# Patient Record
Sex: Male | Born: 1945
Health system: Southern US, Community
[De-identification: ages and names within clinical notes are randomized; demographics above are authoritative.]

## PROBLEM LIST (undated history)

## (undated) DIAGNOSIS — N400 Enlarged prostate without lower urinary tract symptoms: Secondary | ICD-10-CM

## (undated) DIAGNOSIS — K219 Gastro-esophageal reflux disease without esophagitis: Secondary | ICD-10-CM

## (undated) DIAGNOSIS — E785 Hyperlipidemia, unspecified: Secondary | ICD-10-CM

## (undated) DIAGNOSIS — H409 Unspecified glaucoma: Secondary | ICD-10-CM

## (undated) DIAGNOSIS — Z7251 High risk heterosexual behavior: Secondary | ICD-10-CM

## (undated) DIAGNOSIS — B459 Cryptococcosis, unspecified: Secondary | ICD-10-CM

## (undated) DIAGNOSIS — H269 Unspecified cataract: Secondary | ICD-10-CM

## (undated) DIAGNOSIS — B20 Human immunodeficiency virus [HIV] disease: Secondary | ICD-10-CM

## (undated) DIAGNOSIS — E119 Type 2 diabetes mellitus without complications: Secondary | ICD-10-CM

## (undated) DIAGNOSIS — Z9189 Other specified personal risk factors, not elsewhere classified: Secondary | ICD-10-CM

## (undated) DIAGNOSIS — B59 Pneumocystosis: Secondary | ICD-10-CM

## (undated) DIAGNOSIS — K810 Acute cholecystitis: Secondary | ICD-10-CM

## (undated) HISTORY — DX: Pneumocystosis: B59

## (undated) HISTORY — DX: Hyperlipidemia, unspecified: E78.5

## (undated) HISTORY — DX: Cryptococcosis, unspecified: B45.9

## (undated) HISTORY — DX: Unspecified cataract: H26.9

## (undated) HISTORY — DX: Acute cholecystitis: K81.0

## (undated) HISTORY — PX: ERCP: SHX60

## (undated) HISTORY — DX: Gastro-esophageal reflux disease without esophagitis: K21.9

## (undated) HISTORY — PX: CHOLECYSTECTOMY: SHX55

## (undated) HISTORY — DX: Unspecified glaucoma: H40.9

## (undated) HISTORY — DX: Type 2 diabetes mellitus without complications: E11.9

## (undated) HISTORY — DX: Other specified personal risk factors, not elsewhere classified: Z91.89

## (undated) HISTORY — DX: Human immunodeficiency virus (HIV) disease: B20

## (undated) HISTORY — DX: Benign prostatic hyperplasia without lower urinary tract symptoms: N40.0

## (undated) HISTORY — DX: High risk heterosexual behavior: Z72.51

---

## 2000-02-19 ENCOUNTER — Other Ambulatory Visit: Admission: RE | Admit: 2000-02-19 | Discharge: 2000-02-19 | Payer: Self-pay | Admitting: Urology

## 2001-02-23 ENCOUNTER — Emergency Department (HOSPITAL_COMMUNITY): Admission: EM | Admit: 2001-02-23 | Discharge: 2001-02-23 | Payer: Self-pay | Admitting: Emergency Medicine

## 2001-02-23 ENCOUNTER — Encounter: Payer: Self-pay | Admitting: Emergency Medicine

## 2004-12-14 ENCOUNTER — Ambulatory Visit (HOSPITAL_COMMUNITY): Admission: RE | Admit: 2004-12-14 | Discharge: 2004-12-14 | Payer: Self-pay | Admitting: General Surgery

## 2005-04-05 ENCOUNTER — Encounter: Admission: RE | Admit: 2005-04-05 | Discharge: 2005-04-05 | Payer: Self-pay | Admitting: Family Medicine

## 2005-04-30 ENCOUNTER — Inpatient Hospital Stay (HOSPITAL_COMMUNITY): Admission: EM | Admit: 2005-04-30 | Discharge: 2005-05-09 | Payer: Self-pay | Admitting: Emergency Medicine

## 2005-04-30 ENCOUNTER — Encounter: Admission: RE | Admit: 2005-04-30 | Discharge: 2005-04-30 | Payer: Self-pay | Admitting: Family Medicine

## 2005-04-30 ENCOUNTER — Ambulatory Visit: Payer: Self-pay | Admitting: Infectious Diseases

## 2005-05-27 ENCOUNTER — Ambulatory Visit: Payer: Self-pay | Admitting: Infectious Diseases

## 2005-06-19 ENCOUNTER — Encounter: Admission: RE | Admit: 2005-06-19 | Discharge: 2005-06-19 | Payer: Self-pay | Admitting: Family Medicine

## 2005-07-29 ENCOUNTER — Ambulatory Visit (HOSPITAL_COMMUNITY): Admission: RE | Admit: 2005-07-29 | Discharge: 2005-07-29 | Payer: Self-pay | Admitting: Infectious Diseases

## 2005-07-29 ENCOUNTER — Ambulatory Visit: Payer: Self-pay | Admitting: Infectious Diseases

## 2005-07-29 ENCOUNTER — Encounter (INDEPENDENT_AMBULATORY_CARE_PROVIDER_SITE_OTHER): Payer: Self-pay | Admitting: *Deleted

## 2005-07-29 LAB — CONVERTED CEMR LAB
CD4 Count: 160 microliters
CD4 T Cell Abs: 160

## 2005-09-02 ENCOUNTER — Ambulatory Visit: Payer: Self-pay | Admitting: Infectious Diseases

## 2005-11-27 ENCOUNTER — Ambulatory Visit (HOSPITAL_COMMUNITY): Admission: RE | Admit: 2005-11-27 | Discharge: 2005-11-27 | Payer: Self-pay | Admitting: Infectious Diseases

## 2005-11-27 ENCOUNTER — Ambulatory Visit: Payer: Self-pay | Admitting: Infectious Diseases

## 2005-12-16 ENCOUNTER — Ambulatory Visit: Payer: Self-pay | Admitting: Infectious Diseases

## 2006-04-07 ENCOUNTER — Encounter: Admission: RE | Admit: 2006-04-07 | Discharge: 2006-04-07 | Payer: Self-pay | Admitting: Infectious Diseases

## 2006-04-07 ENCOUNTER — Ambulatory Visit: Payer: Self-pay | Admitting: Infectious Diseases

## 2006-04-07 ENCOUNTER — Encounter (INDEPENDENT_AMBULATORY_CARE_PROVIDER_SITE_OTHER): Payer: Self-pay | Admitting: *Deleted

## 2006-04-07 LAB — CONVERTED CEMR LAB: HIV 1 RNA Quant: 61 copies/mL

## 2006-04-21 ENCOUNTER — Ambulatory Visit: Payer: Self-pay | Admitting: Infectious Diseases

## 2006-06-24 ENCOUNTER — Ambulatory Visit: Payer: Self-pay | Admitting: Infectious Diseases

## 2006-06-24 ENCOUNTER — Encounter: Admission: RE | Admit: 2006-06-24 | Discharge: 2006-06-24 | Payer: Self-pay | Admitting: Infectious Diseases

## 2006-06-24 ENCOUNTER — Encounter (INDEPENDENT_AMBULATORY_CARE_PROVIDER_SITE_OTHER): Payer: Self-pay | Admitting: *Deleted

## 2006-06-24 LAB — CONVERTED CEMR LAB: HIV 1 RNA Quant: 49 copies/mL

## 2006-07-14 ENCOUNTER — Ambulatory Visit: Payer: Self-pay | Admitting: Infectious Diseases

## 2006-10-22 ENCOUNTER — Encounter (INDEPENDENT_AMBULATORY_CARE_PROVIDER_SITE_OTHER): Payer: Self-pay | Admitting: *Deleted

## 2006-10-22 ENCOUNTER — Encounter: Admission: RE | Admit: 2006-10-22 | Discharge: 2006-10-22 | Payer: Self-pay | Admitting: Infectious Diseases

## 2006-10-22 ENCOUNTER — Ambulatory Visit: Payer: Self-pay | Admitting: Infectious Diseases

## 2006-10-22 LAB — CONVERTED CEMR LAB
AST: 22 units/L (ref 0–37)
BUN: 15 mg/dL (ref 6–23)
Basophils Relative: 0 % (ref 0–1)
CO2: 21 meq/L (ref 19–32)
HCT: 41.1 % (ref 41.0–49.0)
HIV 1 RNA Quant: 96 copies/mL — ABNORMAL HIGH (ref <50)
Hemoglobin: 13.9 g/dL (ref 13.9–16.8)
MCV: 86.9 fL (ref 78.8–100.0)
Monocytes Absolute: 0.3 10*3/uL (ref 0.2–0.7)
Neutro Abs: 2.4 10*3/uL (ref 1.8–6.8)
Platelets: 275 10*3/uL (ref 152–374)
Potassium: 4.5 meq/L (ref 3.5–5.3)
Sodium: 136 meq/L (ref 135–145)
Total CHOL/HDL Ratio: 9.3
Triglycerides: 442 mg/dL — ABNORMAL HIGH (ref <150)
WBC: 4.8 10*3/uL (ref 3.7–10.0)

## 2006-11-05 ENCOUNTER — Ambulatory Visit: Payer: Self-pay | Admitting: Infectious Diseases

## 2006-12-10 DIAGNOSIS — B59 Pneumocystosis: Secondary | ICD-10-CM | POA: Insufficient documentation

## 2006-12-10 DIAGNOSIS — B459 Cryptococcosis, unspecified: Secondary | ICD-10-CM | POA: Insufficient documentation

## 2006-12-10 DIAGNOSIS — B20 Human immunodeficiency virus [HIV] disease: Secondary | ICD-10-CM | POA: Insufficient documentation

## 2007-01-26 ENCOUNTER — Encounter (INDEPENDENT_AMBULATORY_CARE_PROVIDER_SITE_OTHER): Payer: Self-pay | Admitting: *Deleted

## 2007-01-26 LAB — CONVERTED CEMR LAB

## 2007-01-28 ENCOUNTER — Encounter (INDEPENDENT_AMBULATORY_CARE_PROVIDER_SITE_OTHER): Payer: Self-pay | Admitting: Infectious Diseases

## 2007-02-08 ENCOUNTER — Encounter (INDEPENDENT_AMBULATORY_CARE_PROVIDER_SITE_OTHER): Payer: Self-pay | Admitting: *Deleted

## 2007-04-20 ENCOUNTER — Ambulatory Visit: Payer: Self-pay | Admitting: Infectious Diseases

## 2007-04-20 ENCOUNTER — Encounter: Admission: RE | Admit: 2007-04-20 | Discharge: 2007-04-20 | Payer: Self-pay | Admitting: Infectious Diseases

## 2007-04-20 LAB — CONVERTED CEMR LAB
ALT: 10 units/L (ref 0–53)
AST: 13 units/L (ref 0–37)
Albumin: 4.1 g/dL (ref 3.5–5.2)
Alkaline Phosphatase: 114 units/L (ref 39–117)
CO2: 21 meq/L (ref 19–32)
Chloride: 109 meq/L (ref 96–112)
Creatinine, Ser: 1.16 mg/dL (ref 0.40–1.50)
Eosinophils Absolute: 0.1 10*3/uL (ref 0.0–0.7)
Eosinophils Relative: 1 % (ref 0–5)
Glucose, Bld: 98 mg/dL (ref 70–99)
HIV-1 RNA Quant, Log: <1.7 (ref <1.70)
Ketones, ur: NEGATIVE mg/dL
Neutrophils Relative %: 68 % (ref 43–77)
Nitrite: NEGATIVE
Potassium: 4.2 meq/L (ref 3.5–5.3)
Protein, ur: NEGATIVE mg/dL
Sodium: 140 meq/L (ref 135–145)
Specific Gravity, Urine: 1.025 (ref 1.005–1.03)
Total Protein: 8.2 g/dL (ref 6.0–8.3)
Urobilinogen, UA: 0.2 (ref 0.0–1.0)
WBC: 8.3 10*3/uL (ref 4.0–10.5)

## 2007-05-11 ENCOUNTER — Ambulatory Visit: Payer: Self-pay | Admitting: Infectious Diseases

## 2007-05-12 ENCOUNTER — Encounter (INDEPENDENT_AMBULATORY_CARE_PROVIDER_SITE_OTHER): Payer: Self-pay | Admitting: Infectious Diseases

## 2007-09-07 ENCOUNTER — Encounter: Admission: RE | Admit: 2007-09-07 | Discharge: 2007-09-07 | Payer: Self-pay | Admitting: Infectious Disease

## 2007-09-07 ENCOUNTER — Ambulatory Visit: Payer: Self-pay | Admitting: Infectious Disease

## 2007-09-07 LAB — CONVERTED CEMR LAB
Albumin: 4.2 g/dL (ref 3.5–5.2)
Alkaline Phosphatase: 126 units/L — ABNORMAL HIGH (ref 39–117)
Basophils Absolute: 0 10*3/uL (ref 0.0–0.1)
Basophils Relative: 0 % (ref 0–1)
Creatinine, Ser: 1.18 mg/dL (ref 0.40–1.50)
Eosinophils Absolute: 0.1 10*3/uL (ref 0.0–0.7)
Eosinophils Relative: 1 % (ref 0–5)
HIV-1 RNA Quant, Log: 2.68 — ABNORMAL HIGH (ref <1.70)
Hemoglobin: 14 g/dL (ref 13.0–17.0)
Lymphs Abs: 1.9 10*3/uL (ref 0.7–3.3)
Monocytes Relative: 6 % (ref 3–11)
Neutro Abs: 4.2 10*3/uL (ref 1.7–7.7)
Neutrophils Relative %: 64 % (ref 43–77)
RBC: 4.82 M/uL (ref 4.22–5.81)
Sodium: 141 meq/L (ref 135–145)
Total Protein: 8.2 g/dL (ref 6.0–8.3)

## 2007-09-17 ENCOUNTER — Encounter (INDEPENDENT_AMBULATORY_CARE_PROVIDER_SITE_OTHER): Payer: Self-pay | Admitting: *Deleted

## 2007-09-21 ENCOUNTER — Ambulatory Visit: Payer: Self-pay | Admitting: Infectious Disease

## 2007-09-21 DIAGNOSIS — N400 Enlarged prostate without lower urinary tract symptoms: Secondary | ICD-10-CM

## 2007-09-21 DIAGNOSIS — N401 Enlarged prostate with lower urinary tract symptoms: Secondary | ICD-10-CM | POA: Insufficient documentation

## 2007-10-21 ENCOUNTER — Encounter: Admission: RE | Admit: 2007-10-21 | Discharge: 2007-10-21 | Payer: Self-pay | Admitting: Infectious Disease

## 2007-10-21 ENCOUNTER — Ambulatory Visit: Payer: Self-pay | Admitting: Infectious Disease

## 2007-10-21 LAB — CONVERTED CEMR LAB
Albumin: 4 g/dL (ref 3.5–5.2)
Alkaline Phosphatase: 120 units/L — ABNORMAL HIGH (ref 39–117)
CO2: 20 meq/L (ref 19–32)
Calcium: 8.8 mg/dL (ref 8.4–10.5)
Cholesterol: 255 mg/dL — ABNORMAL HIGH (ref 0–200)
Eosinophils Absolute: 0 10*3/uL — ABNORMAL LOW (ref 0.2–0.7)
Eosinophils Relative: 1 % (ref 0–5)
Glucose, Bld: 87 mg/dL (ref 70–99)
HCT: 40.7 % (ref 39.0–52.0)
HDL: 29 mg/dL — ABNORMAL LOW (ref >39)
HIV-1 RNA Quant, Log: 2.18 — ABNORMAL HIGH (ref <1.70)
Lymphocytes Relative: 36 % (ref 12–46)
MCV: 86.4 fL (ref 78.0–100.0)
Monocytes Relative: 6 % (ref 3–12)
Neutrophils Relative %: 57 % (ref 43–77)
Total Bilirubin: 0.3 mg/dL (ref 0.3–1.2)
Total CHOL/HDL Ratio: 8.8
Triglycerides: 220 mg/dL — ABNORMAL HIGH (ref <150)
VLDL: 44 mg/dL — ABNORMAL HIGH (ref 0–40)
WBC: 5.3 10*3/uL (ref 4.0–10.5)

## 2007-11-09 ENCOUNTER — Ambulatory Visit: Payer: Self-pay | Admitting: Infectious Disease

## 2007-11-09 DIAGNOSIS — E785 Hyperlipidemia, unspecified: Secondary | ICD-10-CM | POA: Insufficient documentation

## 2007-11-11 ENCOUNTER — Telehealth: Payer: Self-pay | Admitting: Infectious Disease

## 2008-03-08 ENCOUNTER — Encounter: Admission: RE | Admit: 2008-03-08 | Discharge: 2008-03-08 | Payer: Self-pay | Admitting: Infectious Disease

## 2008-03-08 ENCOUNTER — Ambulatory Visit: Payer: Self-pay | Admitting: Infectious Disease

## 2008-03-08 LAB — CONVERTED CEMR LAB
AST: 22 units/L (ref 0–37)
Albumin: 4.3 g/dL (ref 3.5–5.2)
BUN: 15 mg/dL (ref 6–23)
Basophils Absolute: 0 10*3/uL (ref 0.0–0.1)
Calcium: 9.1 mg/dL (ref 8.4–10.5)
Creatinine, Ser: 1.23 mg/dL (ref 0.40–1.50)
Glucose, Bld: 99 mg/dL (ref 70–99)
HCT: 41.7 % (ref 39.0–52.0)
Hemoglobin: 13.5 g/dL (ref 13.0–17.0)
Lymphocytes Relative: 44 % (ref 12–46)
MCV: 88 fL (ref 78.0–100.0)
Monocytes Absolute: 0.4 10*3/uL (ref 0.1–1.0)
Neutro Abs: 2.9 10*3/uL (ref 1.7–7.7)
Neutrophils Relative %: 49 % (ref 43–77)
Platelets: 255 10*3/uL (ref 150–400)
RDW: 14.8 % (ref 11.5–15.5)
Total Bilirubin: 0.3 mg/dL (ref 0.3–1.2)
Total CHOL/HDL Ratio: 6
Total Protein: 8.4 g/dL — ABNORMAL HIGH (ref 6.0–8.3)
Triglycerides: 193 mg/dL — ABNORMAL HIGH (ref <150)

## 2008-03-28 ENCOUNTER — Ambulatory Visit: Payer: Self-pay | Admitting: Infectious Disease

## 2008-07-12 ENCOUNTER — Encounter: Admission: RE | Admit: 2008-07-12 | Discharge: 2008-07-12 | Payer: Self-pay | Admitting: Infectious Disease

## 2008-07-12 ENCOUNTER — Ambulatory Visit: Payer: Self-pay | Admitting: Infectious Disease

## 2008-07-12 LAB — CONVERTED CEMR LAB
AST: 20 units/L (ref 0–37)
Alkaline Phosphatase: 113 units/L (ref 39–117)
Basophils Relative: 0 % (ref 0–1)
Chloride: 107 meq/L (ref 96–112)
Cholesterol: 216 mg/dL — ABNORMAL HIGH (ref 0–200)
Creatinine, Ser: 1.25 mg/dL (ref 0.40–1.50)
HIV 1 RNA Quant: 54 copies/mL — ABNORMAL HIGH (ref <50)
LDL Cholesterol: 151 mg/dL — ABNORMAL HIGH (ref 0–99)
Lymphocytes Relative: 32 % (ref 12–46)
Lymphs Abs: 1.7 10*3/uL (ref 0.7–4.0)
MCHC: 32.7 g/dL (ref 30.0–36.0)
MCV: 88 fL (ref 78.0–100.0)
Monocytes Absolute: 0.3 10*3/uL (ref 0.1–1.0)
Monocytes Relative: 5 % (ref 3–12)
Neutrophils Relative %: 61 % (ref 43–77)
Platelets: 256 10*3/uL (ref 150–400)
Total Bilirubin: 0.3 mg/dL (ref 0.3–1.2)
Total CHOL/HDL Ratio: 6.5
Total Protein: 8.7 g/dL — ABNORMAL HIGH (ref 6.0–8.3)
VLDL: 32 mg/dL (ref 0–40)
WBC: 5.4 10*3/uL (ref 4.0–10.5)

## 2008-07-26 ENCOUNTER — Ambulatory Visit: Payer: Self-pay | Admitting: Infectious Disease

## 2008-11-15 ENCOUNTER — Ambulatory Visit: Payer: Self-pay | Admitting: Infectious Disease

## 2008-11-15 LAB — CONVERTED CEMR LAB
ALT: 18 U/L
AST: 17 U/L
Albumin: 4.2 g/dL
Alkaline Phosphatase: 92 U/L
BUN: 16 mg/dL
Basophils Absolute: 0 K/uL
Basophils Relative: 0 %
CO2: 20 meq/L
Calcium: 8.6 mg/dL
Chlamydia, Swab/Urine, PCR: NEGATIVE
Chloride: 108 meq/L
Cholesterol: 164 mg/dL
Creatinine, Ser: 1.34 mg/dL
Eosinophils Absolute: 0.1 K/uL
Eosinophils Relative: 2 %
GC Probe Amp, Urine: NEGATIVE
Glucose, Bld: 100 mg/dL — ABNORMAL HIGH
HCT: 43.1 %
HDL: 30 mg/dL — ABNORMAL LOW
HIV 1 RNA Quant: 339 copies/mL — ABNORMAL HIGH (ref <48)
Hemoglobin: 14.2 g/dL
LDL Cholesterol: 111 mg/dL — ABNORMAL HIGH
Lymphocytes Relative: 32 %
Lymphs Abs: 1.6 K/uL
MCHC: 32.9 g/dL
MCV: 88.1 fL
Monocytes Absolute: 0.2 K/uL
Monocytes Relative: 5 %
Neutro Abs: 3 K/uL
Neutrophils Relative %: 61 %
Platelets: 233 K/uL
Potassium: 4.2 meq/L
RBC: 4.89 M/uL
RDW: 14.5 %
Sodium: 141 meq/L
Total Bilirubin: 0.3 mg/dL
Total CHOL/HDL Ratio: 5.5
Total Protein: 8.1 g/dL
Triglycerides: 116 mg/dL
VLDL: 23 mg/dL
WBC: 4.9 10*3/microliter

## 2008-12-12 ENCOUNTER — Ambulatory Visit: Payer: Self-pay | Admitting: Infectious Disease

## 2009-04-04 ENCOUNTER — Ambulatory Visit: Payer: Self-pay | Admitting: Infectious Disease

## 2009-04-04 LAB — CONVERTED CEMR LAB
Albumin: 4.1 g/dL (ref 3.5–5.2)
BUN: 17 mg/dL (ref 6–23)
Basophils Relative: 0 % (ref 0–1)
Calcium: 8.9 mg/dL (ref 8.4–10.5)
Creatinine, Ser: 1.25 mg/dL (ref 0.40–1.50)
Eosinophils Absolute: 0.1 10*3/uL (ref 0.0–0.7)
GFR calc non Af Amer: 59 mL/min — ABNORMAL LOW (ref >60)
HCT: 39.5 % (ref 39.0–52.0)
HIV-1 RNA Quant, Log: 2.19 — ABNORMAL HIGH (ref <1.68)
Lymphs Abs: 1.8 10*3/uL (ref 0.7–4.0)
MCHC: 33.4 g/dL (ref 30.0–36.0)
MCV: 83.2 fL (ref 78.0–100.0)
Monocytes Absolute: 0.3 10*3/uL (ref 0.1–1.0)
Monocytes Relative: 5 % (ref 3–12)
Neutrophils Relative %: 61 % (ref 43–77)
Platelets: 232 10*3/uL (ref 150–400)
Potassium: 4.2 meq/L (ref 3.5–5.3)
RDW: 14.9 % (ref 11.5–15.5)
Total Bilirubin: 0.3 mg/dL (ref 0.3–1.2)

## 2009-04-20 ENCOUNTER — Ambulatory Visit: Payer: Self-pay | Admitting: Infectious Disease

## 2009-04-20 LAB — CONVERTED CEMR LAB: LDL Goal: 130 mg/dL

## 2009-07-19 ENCOUNTER — Ambulatory Visit: Payer: Self-pay | Admitting: Infectious Disease

## 2009-07-19 LAB — CONVERTED CEMR LAB
AST: 22 units/L (ref 0–37)
Basophils Absolute: 0 10*3/uL (ref 0.0–0.1)
Basophils Relative: 1 % (ref 0–1)
CO2: 24 meq/L (ref 19–32)
Calcium: 9.4 mg/dL (ref 8.4–10.5)
Creatinine, Ser: 1.31 mg/dL (ref 0.40–1.50)
Eosinophils Absolute: 0.1 10*3/uL (ref 0.0–0.7)
Eosinophils Relative: 1 % (ref 0–5)
Glucose, Bld: 87 mg/dL (ref 70–99)
HIV 1 RNA Quant: 225 copies/mL — ABNORMAL HIGH (ref <48)
HIV-1 RNA Quant, Log: 2.35 — ABNORMAL HIGH (ref <1.68)
Hemoglobin: 13.5 g/dL (ref 13.0–17.0)
Lymphs Abs: 2.3 10*3/uL (ref 0.7–4.0)
MCV: 85.9 fL (ref >=78.0)
Monocytes Absolute: 0.3 10*3/uL (ref 0.1–1.0)
Monocytes Relative: 6 % (ref 3–12)
Neutro Abs: 2.9 10*3/uL (ref 1.7–7.7)
Neutrophils Relative %: 52 % (ref 43–77)
Potassium: 4.6 meq/L (ref 3.5–5.3)
RDW: 14.3 % (ref 11.5–15.5)
Sodium: 141 meq/L (ref 135–145)
WBC: 5.6 10*3/uL (ref 4.0–10.5)

## 2009-08-22 ENCOUNTER — Ambulatory Visit: Payer: Self-pay | Admitting: Infectious Disease

## 2010-01-03 ENCOUNTER — Encounter (INDEPENDENT_AMBULATORY_CARE_PROVIDER_SITE_OTHER): Payer: Self-pay | Admitting: *Deleted

## 2010-05-31 ENCOUNTER — Encounter: Payer: Self-pay | Admitting: Infectious Disease

## 2010-06-07 ENCOUNTER — Ambulatory Visit: Payer: Self-pay | Admitting: Infectious Disease

## 2010-06-07 LAB — CONVERTED CEMR LAB
ALT: 17 units/L (ref 0–53)
Albumin: 4.2 g/dL (ref 3.5–5.2)
Calcium: 9 mg/dL (ref 8.4–10.5)
Chloride: 108 meq/L (ref 96–112)
HIV-1 RNA Quant, Log: <1.68 (ref <1.68)
LDL Cholesterol: 112 mg/dL — ABNORMAL HIGH (ref 0–99)
Total CHOL/HDL Ratio: 6
Triglycerides: 194 mg/dL — ABNORMAL HIGH (ref <150)
VLDL: 39 mg/dL (ref 0–40)

## 2010-06-21 ENCOUNTER — Ambulatory Visit: Payer: Self-pay | Admitting: Infectious Disease

## 2010-06-21 ENCOUNTER — Encounter: Payer: Self-pay | Admitting: Ophthalmology

## 2010-06-21 ENCOUNTER — Telehealth: Payer: Self-pay | Admitting: Internal Medicine

## 2010-06-21 DIAGNOSIS — Z9189 Other specified personal risk factors, not elsewhere classified: Secondary | ICD-10-CM | POA: Insufficient documentation

## 2010-06-21 DIAGNOSIS — R1011 Right upper quadrant pain: Secondary | ICD-10-CM | POA: Insufficient documentation

## 2010-06-21 LAB — CONVERTED CEMR LAB
AST: 54 units/L — ABNORMAL HIGH (ref 0–37)
Albumin: 3.5 g/dL (ref 3.5–5.2)
Alkaline Phosphatase: 145 units/L — ABNORMAL HIGH (ref 39–117)
BUN: 25 mg/dL — ABNORMAL HIGH (ref 6–23)
Basophils Absolute: 0 10*3/uL (ref 0.0–0.1)
Basophils Relative: 0 % (ref 0–1)
Creatinine, Ser: 2.19 mg/dL — ABNORMAL HIGH (ref 0.40–1.50)
Eosinophils Absolute: 0 10*3/uL (ref 0.0–0.7)
HCT: 34.6 % — ABNORMAL LOW (ref 39.0–52.0)
Hemoglobin: 11.7 g/dL — ABNORMAL LOW (ref 13.0–17.0)
MCHC: 33.8 g/dL (ref 30.0–36.0)
MCV: 86.3 fL (ref 78.0–100.0)
Monocytes Relative: 6 % (ref 3–12)
Neutro Abs: 14.3 10*3/uL — ABNORMAL HIGH (ref 1.7–7.7)
Potassium: 3.4 meq/L — ABNORMAL LOW (ref 3.5–5.3)
RBC: 4.01 M/uL — ABNORMAL LOW (ref 4.22–5.81)
RDW: 14.5 % (ref 11.5–15.5)
Total Bilirubin: 0.4 mg/dL (ref 0.3–1.2)
WBC: 16.3 10*3/uL — ABNORMAL HIGH (ref 4.0–10.5)

## 2010-06-22 ENCOUNTER — Inpatient Hospital Stay (HOSPITAL_COMMUNITY): Admission: EM | Admit: 2010-06-22 | Discharge: 2010-06-27 | Payer: Self-pay | Admitting: Internal Medicine

## 2010-06-22 ENCOUNTER — Ambulatory Visit: Payer: Self-pay | Admitting: Internal Medicine

## 2010-06-25 ENCOUNTER — Encounter: Payer: Self-pay | Admitting: Internal Medicine

## 2010-06-25 ENCOUNTER — Ambulatory Visit: Payer: Self-pay | Admitting: Internal Medicine

## 2010-06-27 ENCOUNTER — Encounter: Payer: Self-pay | Admitting: Internal Medicine

## 2010-06-27 DIAGNOSIS — K810 Acute cholecystitis: Secondary | ICD-10-CM | POA: Insufficient documentation

## 2010-07-09 ENCOUNTER — Ambulatory Visit: Payer: Self-pay | Admitting: Infectious Disease

## 2010-07-09 LAB — CONVERTED CEMR LAB
AST: 33 units/L (ref 0–37)
Albumin: 2.8 g/dL — ABNORMAL LOW (ref 3.5–5.2)
BUN: 12 mg/dL (ref 6–23)
Basophils Absolute: 0 10*3/uL (ref 0.0–0.1)
CO2: 24 meq/L (ref 19–32)
Calcium: 9 mg/dL (ref 8.4–10.5)
Chloride: 106 meq/L (ref 96–112)
Creatinine, Ser: 1.15 mg/dL (ref 0.40–1.50)
Glucose, Bld: 119 mg/dL — ABNORMAL HIGH (ref 70–99)
Hemoglobin: 11 g/dL — ABNORMAL LOW (ref 13.0–17.0)
Lymphocytes Relative: 18 % (ref 12–46)
Lymphs Abs: 1.4 10*3/uL (ref 0.7–4.0)
Monocytes Absolute: 0.5 10*3/uL (ref 0.1–1.0)
Neutro Abs: 5.5 10*3/uL (ref 1.7–7.7)
Potassium: 4 meq/L (ref 3.5–5.3)
RBC: 3.81 M/uL — ABNORMAL LOW (ref 4.22–5.81)
RDW: 14.3 % (ref 11.5–15.5)
WBC: 7.6 10*3/uL (ref 4.0–10.5)

## 2010-07-30 ENCOUNTER — Ambulatory Visit: Payer: Self-pay | Admitting: Internal Medicine

## 2010-07-30 DIAGNOSIS — K832 Perforation of bile duct: Secondary | ICD-10-CM | POA: Insufficient documentation

## 2010-08-08 ENCOUNTER — Ambulatory Visit: Payer: Self-pay | Admitting: Infectious Disease

## 2010-08-08 DIAGNOSIS — I10 Essential (primary) hypertension: Secondary | ICD-10-CM | POA: Insufficient documentation

## 2010-08-09 ENCOUNTER — Ambulatory Visit (HOSPITAL_COMMUNITY): Admission: RE | Admit: 2010-08-09 | Discharge: 2010-08-09 | Payer: Self-pay | Admitting: Internal Medicine

## 2010-08-09 ENCOUNTER — Ambulatory Visit: Payer: Self-pay | Admitting: Internal Medicine

## 2010-08-15 ENCOUNTER — Encounter (INDEPENDENT_AMBULATORY_CARE_PROVIDER_SITE_OTHER): Payer: Self-pay | Admitting: *Deleted

## 2010-09-06 ENCOUNTER — Ambulatory Visit: Payer: Self-pay | Admitting: Internal Medicine

## 2010-09-06 DIAGNOSIS — K831 Obstruction of bile duct: Secondary | ICD-10-CM | POA: Insufficient documentation

## 2010-09-07 LAB — CONVERTED CEMR LAB
ALT: 57 units/L — ABNORMAL HIGH (ref 0–53)
AST: 64 units/L — ABNORMAL HIGH (ref 0–37)
Albumin: 3.8 g/dL (ref 3.5–5.2)
Alkaline Phosphatase: 382 units/L — ABNORMAL HIGH (ref 39–117)

## 2010-10-08 ENCOUNTER — Ambulatory Visit: Payer: Self-pay | Admitting: Internal Medicine

## 2010-10-11 LAB — CONVERTED CEMR LAB
ALT: 41 units/L (ref 0–53)
AST: 50 units/L — ABNORMAL HIGH (ref 0–37)
Albumin: 3.8 g/dL (ref 3.5–5.2)

## 2010-10-23 ENCOUNTER — Encounter (INDEPENDENT_AMBULATORY_CARE_PROVIDER_SITE_OTHER): Payer: Self-pay | Admitting: *Deleted

## 2010-10-30 ENCOUNTER — Ambulatory Visit: Payer: Self-pay | Admitting: Internal Medicine

## 2010-10-30 ENCOUNTER — Encounter (INDEPENDENT_AMBULATORY_CARE_PROVIDER_SITE_OTHER): Payer: Self-pay

## 2010-10-31 ENCOUNTER — Encounter: Payer: Self-pay | Admitting: Internal Medicine

## 2010-11-01 ENCOUNTER — Ambulatory Visit: Payer: Self-pay | Admitting: Infectious Disease

## 2010-11-01 LAB — CONVERTED CEMR LAB
ALT: 40 units/L (ref 0–53)
Alkaline Phosphatase: 211 units/L — ABNORMAL HIGH (ref 39–117)
Basophils Absolute: 0 10*3/uL (ref 0.0–0.1)
Basophils Relative: 0 % (ref 0–1)
CO2: 25 meq/L (ref 19–32)
Cholesterol: 170 mg/dL (ref 0–200)
Eosinophils Absolute: 0.1 10*3/uL (ref 0.0–0.7)
LDL Cholesterol: 107 mg/dL — ABNORMAL HIGH (ref 0–99)
MCHC: 32.6 g/dL (ref 30.0–36.0)
MCV: 87 fL (ref 78.0–100.0)
Neutrophils Relative %: 57 % (ref 43–77)
Platelets: 251 10*3/uL (ref 150–400)
Potassium: 4.6 meq/L (ref 3.5–5.3)
Sodium: 137 meq/L (ref 135–145)
Total Bilirubin: 0.3 mg/dL (ref 0.3–1.2)
Total Protein: 7.8 g/dL (ref 6.0–8.3)
Triglycerides: 155 mg/dL — ABNORMAL HIGH (ref <150)

## 2010-11-05 ENCOUNTER — Ambulatory Visit: Payer: Self-pay | Admitting: Internal Medicine

## 2010-11-05 LAB — CONVERTED CEMR LAB
ALT: 46 units/L (ref 0–53)
Alkaline Phosphatase: 216 units/L — ABNORMAL HIGH (ref 39–117)
Bilirubin, Direct: 0.1 mg/dL (ref 0.0–0.3)
Total Protein: 7.3 g/dL (ref 6.0–8.3)

## 2010-11-06 ENCOUNTER — Ambulatory Visit (HOSPITAL_COMMUNITY)
Admission: RE | Admit: 2010-11-06 | Discharge: 2010-11-06 | Payer: Self-pay | Source: Home / Self Care | Admitting: Internal Medicine

## 2010-11-08 ENCOUNTER — Telehealth (INDEPENDENT_AMBULATORY_CARE_PROVIDER_SITE_OTHER): Payer: Self-pay

## 2010-11-19 ENCOUNTER — Encounter: Payer: Self-pay | Admitting: Infectious Disease

## 2010-11-19 ENCOUNTER — Ambulatory Visit: Payer: Self-pay | Admitting: Infectious Disease

## 2010-11-19 DIAGNOSIS — R5381 Other malaise: Secondary | ICD-10-CM | POA: Insufficient documentation

## 2010-11-19 DIAGNOSIS — R5383 Other fatigue: Secondary | ICD-10-CM

## 2010-11-19 LAB — CONVERTED CEMR LAB
Chlamydia, Swab/Urine, PCR: NEGATIVE
Free T4: 1 ng/dL (ref 0.80–1.80)
GC Probe Amp, Urine: NEGATIVE
TSH: 1.319 microintl units/mL (ref 0.350–4.500)
Testosterone: 460.22 ng/dL (ref 250–890)

## 2010-11-20 ENCOUNTER — Ambulatory Visit: Payer: Self-pay | Admitting: Internal Medicine

## 2010-12-17 ENCOUNTER — Other Ambulatory Visit: Payer: Self-pay | Admitting: Internal Medicine

## 2010-12-17 ENCOUNTER — Telehealth: Payer: Self-pay | Admitting: Internal Medicine

## 2010-12-17 ENCOUNTER — Ambulatory Visit
Admission: RE | Admit: 2010-12-17 | Discharge: 2010-12-17 | Payer: Self-pay | Source: Home / Self Care | Attending: Internal Medicine | Admitting: Internal Medicine

## 2010-12-17 LAB — HEPATIC FUNCTION PANEL
ALT: 36 U/L (ref 0–53)
AST: 36 U/L (ref 0–37)
Albumin: 3.7 g/dL (ref 3.5–5.2)
Alkaline Phosphatase: 203 U/L — ABNORMAL HIGH (ref 39–117)
Bilirubin, Direct: 0.1 mg/dL (ref 0.0–0.3)
Total Bilirubin: 0.5 mg/dL (ref 0.3–1.2)
Total Protein: 7.6 g/dL (ref 6.0–8.3)

## 2010-12-24 ENCOUNTER — Encounter (INDEPENDENT_AMBULATORY_CARE_PROVIDER_SITE_OTHER): Payer: Self-pay | Admitting: *Deleted

## 2011-01-03 NOTE — Miscellaneous (Signed)
Summary: RW Update  Clinical Lists Changes  Observations: Added new observation of INCOMESOURCE: unknown (12/24/2010 12:24) Added new observation of HOUSEINCOME: 0  (12/24/2010 12:24) Added new observation of HOUSING: Unknown  (12/24/2010 12:24) Added new observation of LATINO/HISP: No  (12/24/2010 12:24)

## 2011-01-03 NOTE — Miscellaneous (Signed)
Summary: orders for ERCP/Cipro  Clinical Lists Changes  Orders: Added new Test order of ERCP (ERCP) - Signed

## 2011-01-03 NOTE — Miscellaneous (Signed)
Summary: Orders Update - LAB ORDERS  Clinical Lists Changes  Orders: Added new Test order of T-Lipid Profile [phone]) - Signed Added new Test order of T-CBC w/Diff [phone]) - Signed Added new Test order of T-CD4SP [facility]) (CD4SP) - Signed Added new Test order of T-Comprehensive Metabolic Panel [phone]) - Signed Added new Test order of T-HIV Viral Load [phone]) - Signed Added new Test order of T-RPR (Syphilis) ([id number]) - Signed     Process Orders Check Orders Results:     [facility]: ABN not required for this insurance Order queued for requisitioning for [facility]: May 31, 2010 9:58 AM  Tests Sent for requisitioning (May 31, 2010 9:58 AM):     06/07/2010: [facility] -- T-Lipid Profile [phone] (signed)     06/07/2010: [facility] -- T-CBC w/Diff [[id number]] (signed)     06/07/2010: [facility] -- T-Comprehensive Metabolic Panel [[id number]] (signed)     06/07/2010: [facility] -- T-HIV Viral Load [phone] (signed)     06/07/2010: [facility] -- T-RPR (Syphilis) [phone] (signed)

## 2011-01-03 NOTE — Letter (Signed)
Summary: Appt Reminder 2  [facility]  [address] [place], [place] [address]   Phone: [phone]  Fax: [phone]        August 15, 2010 MRN: [id number]    Jay Hoffman [address] [place], [place]  [address]    Dear Mr. SEDER,   You have a return appointment with Dr. Yancey Flemings on Thursday -Oct.6,2011 at 1:30 p.m. for follow up after your hospital procedure done 08/09/10 at [facility]. Please remember to bring a complete list of the medicines you are taking, your insurance card and your co-pay.  If you have to cancel or reschedule this appointment, please call before 5:00 pm the evening before to avoid a cancellation fee.  If you have any questions or concerns, please call [phone].    Sincerely,    Teryl Lucy RN

## 2011-01-03 NOTE — Assessment & Plan Note (Signed)
Summary: F/U/VS   Visit Type:  Follow-up Referring Provider:  na Primary Provider:  na  CC:  follow-up visit and Hypertension Management.  History of Present Illness: 65 yo with HIV well controlled, whom I admitted in July with abdominal pain intermitent and fever, found to ahve perforated gallbladder sp lap resection and placmetn of stent by GI. He is doing well off of antibiotoics and is scheduled for remoavel of stent in next two days. He has no compalints today.  Hypertension History:      Positive major cardiovascular risk factors include male age 47 years old or older, hyperlipidemia, and hypertension.  Negative major cardiovascular risk factors include no history of diabetes, negative family history for ischemic heart disease, and non-tobacco-user status.        Further assessment for target organ damage reveals no history of ASHD, stroke/TIA, or peripheral vascular disease.    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 Once Daily 2)  Finasteride 5 Mg  Tabs (Finasteride) .... Take 1 Tablet By Mouth Once A Day 3)  Pravachol 40 Mg Tabs (Pravastatin Sodium) .... Take 1 Tablet By Mouth Once A Day 4)  Aleve 220 Mg Tabs (Naproxen Sodium) .... As Needed  Current Medications (verified): 1)  Atripla 600-200-300 Mg  Tabs (Efavirenz-Emtricitab-Tenofovir) .... Once Daily 2)  Finasteride 5 Mg  Tabs (Finasteride) .... Take 1 Tablet By Mouth Once A Day 3)  Pravachol 40 Mg Tabs (Pravastatin Sodium) .... Take 1 Tablet By Mouth Once A Day 4)  Aleve 220 Mg Tabs (Naproxen Sodium) .... As Needed  Allergies: No Known Drug Allergies   Preventive Screening-Counseling & Management  Alcohol-Tobacco     Alcohol drinks/day: 0     Alcohol type: occassional beer     Smoking Status: never     Passive Smoke Exposure: no  Caffeine-Diet-Exercise     Caffeine use/day: 3     Does Patient Exercise: yes     Type of exercise: walking     Exercise (avg: min/session): <30     Times/week: <3  Hep-HIV-STD-Contraception     HIV Risk: no  Safety-Violence-Falls     Seat Belt Use: 100  Comments: declined condoms      Sexual History:  currently monogamous.        Drug Use:  never.     Current Allergies (reviewed today): No known allergies  Social History: Sexual History:  currently monogamous Drug Use:  never  Review of Systems  The patient denies anorexia, fever, weight loss, weight gain, vision loss, decreased hearing, hoarseness, chest pain, syncope, dyspnea on exertion, peripheral edema, prolonged cough, headaches, hemoptysis, abdominal pain, melena, hematochezia, severe indigestion/heartburn, hematuria, incontinence, genital sores, muscle weakness, suspicious skin lesions, transient blindness, difficulty walking, depression, unusual weight change, abnormal bleeding, and enlarged lymph nodes.    Vital Signs:  Patient profile:   65 year old  male Height:      68.5 inches (173.99 cm) Weight:      195 pounds (88.64 kg) BMI:     29.32 Temp:     98.9 degrees F (37.17 degrees C) oral Pulse rate:   80 / minute BP sitting:   113 / 75  (left arm) Cuff size:   large  Vitals Entered By: Jennet Maduro RN (August 08, 2010 9:21 AM) CC: follow-up visit, Hypertension Management Is Patient Diabetic? No Pain  Assessment Patient in pain? no      Nutritional Status BMI of 25 - 29 = overweight Nutritional Status Detail appetite "good"  Have you ever been in a relationship where you felt threatened, hurt or afraid?No   Does patient need assistance? Functional Status Self care Ambulation Normal Comments no missed doses of rxes   Physical Exam  General:  alert.  well-nourished.   Head:  normocephalic and atraumatic.  no maxillary tenderness Eyes:  vision grossly intact, pupils equal, pupils round,  Ears:  no external deformities.   Nose:  no external deformity.   Mouth:  pharynx pink and moist, no erythema, and no exudates.   Neck:  full ROM.   Lungs:  normal respiratory effort, no crackles, and no wheezes.   Heart:  normal rate, regular rhythm, no murmur, no gallop, and no rub.   Abdomen:  minimal tenderness diffusely, pos bowel sounds Msk:  normal ROM and no joint tenderness.   Extremities:  trace left pedal edema and 1+ left pedal edema.   Neurologic:  alert & oriented X3 and strength normal in all extremities.   Skin:  well healed scars Psych:  Oriented X3, memory intact for recent and remote, and normally interactive.          Medication Adherence: 08/08/2010   Adherence to medications reviewed with patient. Counseling to provide adequate adherence provided                                Impression & Recommendations:  Problem # 1:  HIV INFECTION (ICD-042) Excellent control! Orders: Est. Patient Level IV (99214)Future Orders: T-CD4SP ([facility]) (CD4SP) ... 10/08/2010 T-HIV Viral Load [phone]) ... 10/08/2010 T-CBC w/Diff ([id number]) ... 10/08/2010 T-Comprehensive Metabolic Panel [phone]) ... 10/08/2010 T-Lipid Profile [phone]) ... 10/08/2010 T-RPR (Syphilis) [phone]) ... 10/08/2010  Diagnostics Reviewed:  HIV: CDC-defined AIDS (01/03/2010)   CD4: 320 (06/08/2010)   WBC: 7.6 (07/09/2010)   Hgb: 11.0 (07/09/2010)   HCT: 33.2 (07/09/2010)    Platelets: 446 (07/09/2010) HIV genotype: REPORT (10/21/2007)   HIV-1 RNA: <48 copies/mL (06/07/2010)   HBSAg: No (01/26/2007)  Problem # 2:  ESSENTIAL HYPERTENSION (ICD-401.9) BP well controlled at present Orders: Est. Patient Level IV (99214)  BP today: 113/75 Prior BP: 120/72 (07/30/2010)  10 Yr Risk Heart Disease: 18 % Prior 10 Yr Risk Heart Disease: 14 % (04/20/2009)  Labs Reviewed: K+: 4.0 (07/09/2010) Creat: : 1.15 (07/09/2010)   Chol: 181 (06/07/2010)   HDL: 30 (06/07/2010)   LDL: 112 (06/07/2010)   TG: 194 (06/07/2010)  Problem # 3:  PERFORATION OF BILE DUCT (ICD-576.3) sp cholecystectomy. To have stent taken out in am I believe Orders: Est. Patient Level IV (47425)  Other Orders: Influenza Vaccine NON MCR (95638) Pneumococcal Vaccine (75643) Admin 1st Vaccine (32951)  Hypertension Assessment/Plan:      The patient's hypertensive risk group is category B: At least one risk factor (excluding diabetes) with no target organ damage.  His calculated 10 year risk of coronary heart disease is 18 %.  Today's blood pressure is 113/75.     Patient Instructions: 1)  GOOD luck with procedure tomorrow 2)  Come back for labs fasting in November and appt with Dr. Daiva Eves in December    Immunizations Administered:  Pneumonia Vaccine:    Vaccine Type: Pneumovax    Site: left deltoid    Mfr: Merck    Dose: 0.5 ml    Route: IM    Given by: Jennet Maduro RN    Exp. Date: 12/23/2011    Lot #: [id number]        Medication Adherence: 08/08/2010   Adherence to medications reviewed with patient. Counseling to provide adequate adherence provided                                Influenza Vaccine    Vaccine Type: Fluvax Non-MCR    Site: right deltoid    Mfr: novartis    Dose: 0.5 ml    Route: IM    Given by: Jennet Maduro RN    Exp. Date: 03/03/2011    Lot #: [id number]  Flu Vaccine Consent Questions    Do you have a history of severe allergic reactions to this  vaccine? no    Any prior history of allergic reactions to egg and/or gelatin? no    Do you have a sensitivity to the preservative Thimersol? no    Do you have a past history of Guillan-Barre Syndrome? no    Do you currently have an acute febrile illness? no    Have you ever had a severe reaction to latex? no    Vaccine information given and explained to patient? yes

## 2011-01-03 NOTE — Progress Notes (Signed)
Summary: abnormal stat labs  Phone Note Other Incoming   Reason for Call: Talk to Doctor Reason for Call: Discuss lab or test results Summary of Call: [facility] called with critical lab values that reflected elevated white count with neutrophelia, AKI with doubling of creatinine and negative lab values of amylase, lipase and lactic acid. Pt had seen Dr. Daiva Eves early morning for epigastric pain. I talked to Dr. Daiva Eves regarding these findings and we both agreed that pt needs to be admitted for CT abd/pelvis and fluids for AKI. Pt was called and agreed to the plan. Pt will be admited in tele room.  Initial call taken by: Clerance Lav MD,  June 21, 2010 11:33 PM

## 2011-01-03 NOTE — Discharge Summary (Signed)
Summary: Hospital Discharge Update    Hospital Discharge Update:  Date of Admission: 06/21/2010 Date of Discharge: 06/27/2010  Brief Summary:  Pt is a 65 yo AA male with PMHx of HIV, Hyperlipidemia, and BPH who presented to [facility] on referral from Dr. Clinton Gallant office secondary to 1 week history of abdominal pain, elevated WBC count, and low grade fever. Pt notes development of diffuse abdominal pain, especially on the RUQ that was worsened with eating fatty foods, such as beef. Intensity greatest on first day, then somewhat waxed and waned. Secondary to presenting symptoms, there was concern of cholecystitis on admission, and pt was started on Unasyn. US Abdomen ordered suggesting findings consistent with cholecystitis, and concern for possible GB perforation. Secondary to these critical results, surgery consult was made, CT abdomen was ordered showing . On 06/24/10, pt had laparoscopic cholecystectomy and CBD repair for gangrenous cholecystitis with CBD perforation. GI was consulted and ERCP with CBD stenting was performed on 06/26/10. Abdominal pain well managed on pain regimen. During hospital course, pt also with AKI and was treated with IVF with resolution by time of discharge. Pt had elevated bps during hospitalization to 150s systolic, thought to be secondary to pain. Was not discharged on bp meds. Also, pt with mild hypokalemia on day of discharge,possibly secondary to decreased by mouth intake since pt NPO multiple days for surgery, and surgery. Pt repleted of K.  Lab or other results pending at discharge:  Blood cultures - prelim are negative thus far.  Labs needed at follow-up: CBC with differential  Other follow-up issues:  Although clinically stable, pt 's WBC count on discharge was increased as compared to day prior, may be secondary to recent surgery ans ERCP.  He will be discharged on Augmentin x10 days. However, please follow-up.  Problem list changes:  Added new problem of ACUTE  CHOLECYSTITIS (ICD-575.0)  Medication list changes:  Added new medication of AUGMENTIN 875-125 MG TABS (AMOXICILLIN-POT CLAVULANATE) Take 1 tablet by mouth two times a day - Signed Added new medication of OMEPRAZOLE 40 MG CPDR (OMEPRAZOLE) Take 1 tablet by mouth once a day - Signed Rx of AUGMENTIN 875-125 MG TABS (AMOXICILLIN-POT CLAVULANATE) Take 1 tablet by mouth two times a day;  #20 x 0;  Signed;  Entered by: Johnette Abraham DO;  Authorized by: Johnette Abraham DO;  Method used: Print then Give to Patient Rx of OMEPRAZOLE 40 MG CPDR (OMEPRAZOLE) Take 1 tablet by mouth once a day;  #30 x 3;  Signed;  Entered by: Johnette Abraham DO;  Authorized by: Johnette Abraham DO;  Method used: Print then Give to Patient  Discharge medications:  ATRIPLA 600-200-300 MG TABS (EFAVIRENZ-EMTRICITAB-TENOFOVIR) once daily FINASTERIDE 5 MG  TABS (FINASTERIDE) Take 1 tablet by mouth once a day PRAVACHOL 40 MG TABS (PRAVASTATIN SODIUM) Take 1 tablet by mouth once a day AUGMENTIN 875-125 MG TABS (AMOXICILLIN-POT CLAVULANATE) Take 1 tablet by mouth two times a day OMEPRAZOLE 40 MG CPDR (OMEPRAZOLE) Take 1 tablet by mouth once a day  Other patient instructions:  1. Please follow-up with Dr. Daiva Eves in 1-2 weeks [phone]) at which time you will have blood work to reassess your infection.  2. Please follow-up with Dr.Wyatt within 1 week. You must call to make an appointment and at which time you will most likely have drain removal.  3. Please follow-up with Dr. Marina Goodell at [facility] on August 29,2011 at 8:30AM.  4. If you have severe abdominal pain, high fevers, bleeding from abdominal surgery,  please return to ER.   Note: Hospital Discharge Medications & Other Instructions handout was printed, one copy for patient and a second copy to be placed in hospital chart.

## 2011-01-03 NOTE — Assessment & Plan Note (Signed)
Summary: F/U/VS   Referring Clarise Chacko:  na Primary Geronimo Diliberto:  Jay Hoffman   History of Present Illness: 65 yo with HIV well controlled, whom I admitted in July with abdominal pain intermitent and fever, found to ahve perforated gallbladder sp lap resection and placmetn of stent by GI He was brought back in December for ERCP which showed .  Re-examination today showed improvedappearance of the stricture with some residual wasting. An additional Stent was placed in hopes of improving th  chance that he will not develop high-grade stricturing with obstruction. Pt has otherwise notcied some fatigue for several months and asked me to check his testosterone. He does nothave erectile dysfunction or other evidence of hypogonadism but this is certainly reasonable to check.His pcp had detected elevated LFts recently but I wonder if this was just prior or post ERCP.Jay Hoffman, Deboraha Sprang  Hypertension History:      Positive major cardiovascular risk factors include male age 45 years old or older, hyperlipidemia, and hypertension.  Negative major cardiovascular risk factors include no history of diabetes, negative family history for ischemic heart disease, and non-tobacco-user status.        Further assessment for target organ damage reveals no history of ASHD, stroke/TIA, or peripheral vascular disease.    Lipid Management History:      Positive NCEP/ATP III risk factors include male age 44 years old or older, HDL cholesterol less than 40, and hypertension.  Negative NCEP/ATP III risk factors include non-diabetic, no family history for ischemic heart disease, non-tobacco-user status, no ASHD (atherosclerotic heart disease), no prior stroke/TIA, no peripheral vascular disease, and no history of aortic aneurysm.    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 Once Daily 2)  Finasteride 5 Mg  Tabs (Finasteride) .... Take 1 Tablet By Mouth Once A Day 3)  Pravachol 40 Mg Tabs (Pravastatin Sodium) .... Take 1 Tablet By Mouth Once A Day 4)  Aleve 220 Mg Tabs (Naproxen Sodium) .... As Needed  Current Medications (verified): 1)  Atripla 600-200-300 Mg Tabs (Efavirenz-Emtricitab-Tenofovir) .... Once Daily 2)  Finasteride 5 Mg  Tabs (Finasteride) .... Take 1 Tablet By Mouth Once A Day 3)  Pravachol 40 Mg Tabs (Pravastatin Sodium) .... Take 1 Tablet By Mouth Once A Day 4)  Aleve 220 Mg Tabs (Naproxen Sodium) .... As Needed  Allergies (verified): No Known Drug Allergies      Current Allergies (reviewed today): No known allergies  Past History:  Past Medical History: Last updated: 07/30/2010 ACUTE CHOLECYSTITIS (ICD-575.0) FEVER, HX OF (ICD-V15.9) ABDOMINAL PAIN RIGHT UPPER QUADRANT (ICD-789.01) SPECIAL SCREENING FOR MALIGNANT NEOPLASMS COLON (ICD-V76.51) NEED PROPHYLACTIC VACCINATION&INOCULATION FLU (ICD-V04.81) HEALTH SCREENING (ICD-V70.0) HYPERLIPIDEMIA, WITH LOW HDL (ICD-272.4) HIGH-RISK SEXUAL BEHAVIOR (ICD-V69.2) BENIGN PROSTATIC HYPERTROPHY, MILD, HX OF (ICD-V13.8) Hx of CRYPTOCOCCOSIS (ICD-117.5) PNEUMOCYSTIS PNEUMONIA (ICD-136.3) HIV  INFECTION (ICD-042)  Past Surgical History: Last updated: 07/30/2010 Cholecystectomy--June 24, 2010   Family History: Last updated: 07/30/2010 no early CAD No FH of Colon Cancer: Family History of Esophageal Cancer: Father  Family History of Diabetes: Paternal Aunt  Family History of Kidney Disease:Sister   Social History: Last updated: 07/30/2010 Retired Theatre stage manager Married 2 Childern Never Smoked Alcohol use-yes Drug use-no Daily Caffeine Use: one daily   Risk Factors: Alcohol Use: 0 (08/08/2010) Caffeine Use: 3 (08/08/2010) Exercise: yes (08/08/2010)  Risk Factors: Smoking Status: never (08/08/2010) Passive Smoke Exposure: no (08/08/2010)  Family History: Reviewed history from 07/30/2010 and no changes required. 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 Retired Theatre stage manager Married 2 Childern Never Smoked Alcohol use-yes Drug use-no Daily Caffeine Use: one daily   Review of Systems  The patient denies anorexia, fever, weight loss, weight gain, vision loss, decreased hearing, hoarseness, chest pain, syncope, dyspnea on exertion, peripheral edema, prolonged cough, headaches, hemoptysis, abdominal pain, melena, hematochezia, severe indigestion/heartburn, hematuria, incontinence, genital sores, muscle weakness, suspicious skin lesions, transient blindness, difficulty walking, depression, unusual weight change, abnormal bleeding, and enlarged lymph nodes.    Vital Signs:  Patient profile:   66 year old male Height:      68.5 inches (173.99 cm) Weight:      199.75 pounds (90.80 kg) BMI:     30.04 Temp:     97.5 degrees F (36.39 degrees C) oral Pulse rate:   79 / minute BP sitting:   124 / 80  (left arm)  Vitals Entered By: Starleen Arms CMA (November 19, 2010 9:24 AM)  Physical Exam  General:  alert.  well-nourished.   Head:  normocephalic and  atraumatic.  no maxillary tenderness Eyes:  vision grossly intact, pupils equal, pupils round,  Ears:  no external deformities.   Nose:  no external deformity.   Mouth:  pharynx pink and moist, no erythema, and no exudates.   Neck:  full ROM.   Lungs:  normal respiratory effort, no crackles, and no wheezes.   Heart:  normal rate, regular rhythm, no murmur, no gallop, and no rub.   Abdomen:  nontender, nondistended,  pos bowel sounds Msk:  normal ROM and no joint tenderness.   Extremities:  trace left pedal edema and 1+ left pedal edema.   Neurologic:  alert & oriented X3 and strength normal in all extremities.   Skin:  well healed scars Psych:  Oriented X3, memory intact for recent and remote, and normally interactive.     Impression & Recommendations:  Problem # 1:  HIV INFECTION (ICD-042) superb control Orders: T-GC Probe, urine ([id number]) T-Chlamydia  Probe, urine ([id number]) Est. Patient Level IV (99214)Future Orders: T-HIV Viral Load ([id number]) ... 03/20/2011 T-CD4SP ([facility]) (CD4SP) ... 03/20/2011 T-CBC w/Diff ([id number]) ... 03/20/2011 T-Comprehensive Metabolic Panel [id number]) ... 03/20/2011 T-Lipid Profile [phone]) ... 03/20/2011 T-RPR (Syphilis) [phone]) ... 03/20/2011  Diagnostics Reviewed:  HIV: CDC-defined AIDS (01/03/2010)   CD4: 360 (11/02/2010)   WBC: 5.3 (11/01/2010)   Hgb: 13.7 (11/01/2010)   HCT: 42.0 (11/01/2010)   Platelets: 251 (11/01/2010) HIV genotype: REPORT (10/21/2007)   HIV-1 RNA: <20 copies/mL (11/01/2010)   HBSAg: No (01/26/2007)  Problem # 2:  BILE DUCT STRICTURE (ICD-576.2)  sp another stent on 11/06/10. Need to make sure that [facility] letting his  PCP know about the ERCP. I will fax them my note as well  Orders: Est. Patient Level IV (82956)  Problem # 3:  FATIGUE (ICD-780.79) will check tfts, testosterone. Orders: T-Testosterone; Total [phone]) T-T4, Free [phone]) T-TSH ([phone]) Est. Patient Level IV  (53664)  Problem # 4:  ESSENTIAL HYPERTENSION (ICD-401.9)  at goal  Orders: Est. Patient Level IV (40347)  Problem # 5:  PERFORATION OF BILE DUCT (ICD-576.3)  see HPI sp cholecystectomy  Orders: Est. Patient Level IV (42595)  Problem # 6:  HYPERLIPIDEMIA, WITH LOW HDL (ICD-272.4) at goal His updated medication list for this problem includes:    Pravachol 40 Mg Tabs (Pravastatin sodium) .Marland Kitchen... Take 1 tablet by mouth once a day  Hypertension Assessment/Plan:      The patient's hypertensive risk group is category B: At least one risk factor (excluding diabetes) with no target organ damage.  His calculated 10 year risk of coronary heart disease is 18 %.  Today's blood pressure is 124/80.  His blood pressure goal is < 140/90.  Lipid Assessment/Plan:      Based on NCEP/ATP III, the patient's risk factor category is "2 or more risk factors and a calculated 10 year CAD risk of < 20%".  The patient's lipid goals are as follows: Total cholesterol goal is 200; LDL cholesterol goal is 130; HDL cholesterol goal is 40; Triglyceride goal is 150.  His LDL cholesterol goal has been met.         Medication Adherence: 11/19/2010   Adherence to medications reviewed with patient. Counseling to provide adequate adherence provided   Prevention For Positives: 11/19/2010   Safe sex practices discussed with patient. Condoms offered.   Education Materials Provided: 11/19/2010 Safe sex practices discussed with patient. Condoms offered.                            Patient Instructions: 1)  Please schedule a follow-up appointment in 4 months. 2)  Be sure to return for lab work one (1) week before your next appointment as scheduled. 3)  Advised not to eat any food or drink any liquids after 10 PM the night before labs

## 2011-01-03 NOTE — Letter (Signed)
Summary: EGD Instructions  [facility]  [address] [place], [place] [address]   Phone: [phone]  Fax: [phone]       NAOL ONTIVEROS    1946/10/02    MRN: [id number]       Procedure Day Dorna Bloom: Jake Shark 11/06/10     Arrival Time:  8:00am     Procedure Time:  9:00am     Location of Procedure:                     [facility] ( Outpatient Registration)    PREPARATION FOR ERCP WITH STENT CHANGE   On TUESDAY 11/06/10, THE DAY OF THE PROCEDURE:  1.   No solid foods, milk or milk products or liquids are allowed after midnight the night before your procedure                                                                                                                 MEDICATION INSTRUCTION Unless otherwise instructed, you should take regular prescription medications with asmall sip of water as early as possible the morning of your predur      OTHER INSTRUCTIONS  Youwill need a responsible adult at least 65 years of age to accompany you and drive you home.   This person must remain in the waiting room during your procedure.  Wear loose fitting clothing that is easily removed.  Leave jewelry and other valuables at home.  However, you may wish to bring a book to read or an iPod/MP3 player to listen to music as you wait for your procedure to start.  Remove all body piercing jewelry and leave at home.  Total time from sign-in until discharge is approximately 2-3 hours.  You should go home directly after your procedure and rest.  You can resume normal activities the day after your procedure.  The day of your procedure you should not:   Drive   Make legal decisions   Operate machinery   Drink alcohol   Return to work  You will receive specific instructions about eating, activities and medications before you leave.    The above instructions have been reviewed and explained to me by   Ulis Rias RN  October 30, 2010  1:53 PM     I fully understand and can verbalize these instructions _____________________________ Date _________

## 2011-01-03 NOTE — Letter (Signed)
Summary: EGD Instructions  [facility]  [address] [place], [place] [address]   Phone: [phone]  Fax: [phone]       Jay Hoffman    1946/03/02    MRN: [id number]       Procedure Day /Date:THURSDAY, 08/09/10      Arrival Time:12:15 PM     Procedure Time:1:15 PM     Location of Procedure:                     [facility] ( Outpatient Registration)    PREPARATION FOR ENDOSCOPY/ERCP WITH STENT REMOVAL   On_THURSDAY, 08/09/10 THE DAY OF THE PROCEDURE:  1.   No solid foods, milk or milk products are allowed after midnight the night before your procedure.  2.   Do not drink anything colored red or purple.  Avoid juices with pulp.  No orange juice.  3.  You may drink clear liquids until 9:15 AM, which is 4hours before your procedure.                                                                                                CLEAR LIQUIDS INCLUDE: Water Jello Ice Popsicles Tea (sugar ok, no milk/cream) Powdered fruit flavored drinks Coffee (sugar ok, no milk/cream) Gatorade Juice: apple, white grape, white cranberry  Lemonade Clear bullion, consomm, broth Carbonated beverages (any kind) Strained chicken noodle soup Hard Candy   MEDICATION INSTRUCTIONS  Unless otherwise instructed, you should take regular prescription medications with a small sip of water as early as possible the morning of your procedure.          OTHER INSTRUCTIONS  You will need a responsible adult at least 65 years of age to accompany you and drive you home.   This person must remain in the waiting room during your procedure.  Wear loose fitting clothing that is easily removed.  Leave jewelry and other valuables at home.  However, you may wish to bring a book to read or an iPod/MP3 player to listen to music as you wait for your procedure to start.  Remove all body piercing jewelry and leave at home.  Total time from sign-in until discharge is approximately  2-3 hours.  You should go home directly after your procedure and rest.  You can resume normal activities the day after your procedure.  The day of your procedure you should not:   Drive   Make legal decisions   Operate machinery   Drink alcohol   Return to work  You will receive specific instructions about eating, activities and medications before you leave.    The above instructions have been reviewed and explained to me by   _______________________    I fully understand and can verbalize these instructions _____________________________ Date _________

## 2011-01-03 NOTE — Miscellaneous (Signed)
  Clinical Lists Changes  Observations: Added new observation of YEARAIDSPOS: 2006  (10/23/2010 15:38) 

## 2011-01-03 NOTE — Assessment & Plan Note (Signed)
Summary: 69month f/u [mkj]   Visit Type:  Follow-up  CC:  f/u and have feeling.  History of Present Illness: 65 yo  [place] male with HIV, BPH, hyperlididemia on Atripla who presents for routine followup to clinic. He had substantial epigastric pain over the weekend that he thought was due to "gas" with bloating feeling. He also at times felt chills. This improved and he had felt better. He had a similar episode in the past. In the clinic he had a low grade temperature of 100 and on exam was tender in the right upper quadrant without rebound. His stools were soft on Tuesday buthe has had not bowel movment since then. He has had no dysuria or suprapubic pain. He denies light headedness dizziness or nausea. He has developed some sinus congestion over the past day. 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 Once Daily 2)  Finasteride 5 Mg  Tabs (Finasteride) 3)  Pravachol 40 Mg Tabs (Pravastatin Sodium) .... Take 1 Tablet By Mouth Once A Day  Current Medications (verified): 1)  Atripla 600-200-300 Mg Tabs (Efavirenz-Emtricitab-Tenofovir) .... Once Daily 2)  Finasteride 5 Mg  Tabs (Finasteride) 3)  Pravachol 40 Mg Tabs (Pravastatin Sodium) .... Take 1 Tablet By Mouth Once A Day  Allergies: No Known Drug Allergies    Current Allergies: No known allergies  Past History:  Past Medical History: Last updated: 03/28/2008 HIV infection Pneumocystitis PNA Cryptococcus  Pneumonia) BPH Hyperlipidemia  Family History: Last updated: 11/09/2007 no early CAD  Social History: Last updated: 09/21/2007 Married Never Smoked Alcohol use-yes Drug use-no  Risk Factors: Alcohol Use: 0 (08/22/2009) Caffeine Use: 3 (08/22/2009) Exercise: no (08/22/2009)  Risk Factors: Smoking Status: never (08/22/2009) Passive Smoke Exposure: no (08/22/2009)  Past Surgical History: None  Review of Systems       The patient complains of abdominal pain and severe indigestion/heartburn.  The patient denies anorexia, fever, weight loss, weight gain, vision loss, decreased hearing, hoarseness, chest pain, syncope, dyspnea on exertion, peripheral edema, prolonged cough, headaches, hemoptysis, melena, hematochezia, hematuria, incontinence, genital sores, muscle weakness, suspicious skin lesions, transient blindness, difficulty walking, depression, unusual weight change, abnormal bleeding, and enlarged lymph nodes.    Vital Signs:  Patient profile:   65 year old male Height:      68.5 inches (173.99 cm) Weight:      203.50 pounds (92.50 kg) BMI:     30.60 Temp:     100.1 degrees F (37.83 degrees C) oral Pulse rate:   91 / minute BP sitting:   117 / 76  (left arm)  Vitals Entered By: Starleen Arms CMA (June 21, 2010 9:53 AM) CC: f/u, have feeling Is Patient Diabetic? No Pain Assessment Patient in pain? no      Nutritional Status nlBMI of > 30 = obese Nutritional Status Detail nl  Does patient need assistance? Functional Status Self care Ambulation Normal   Physical Exam  General:  alert.  well-nourished.   Head:  normocephalic and atraumatic.  no maxillary tenderness Eyes:  vision grossly  intact, pupils equal, pupils round,  Ears:  no external deformities.   Nose:  no external deformity.   Mouth:  pharynx pink and moist, no erythema, and no exudates.   Neck:  full ROM.   Lungs:  normal respiratory effort, no crackles, and no wheezes.   Heart:  normal  rate, regular rhythm, no murmur, no gallop, and no rub.   Abdomen:  soft, tender to palpation in the RUQ wihtou rebound, with postive bs, liver edge appears slightly enlarged at cm below costal margin on right. Multiple lipomas in soft tissue Msk:  normal ROM and no joint tenderness.   Extremities:  trace left pedal edema and 1+ left pedal edema.   Neurologic:  alert & oriented X3 and strength normal in all extremities.          Medication Adherence: 06/21/2010   Adherence to medications reviewed with patient. Counseling to provide adequate adherence provided   Prevention For Positives: 06/21/2010   Safe sex practices discussed with patient. Condoms offered.   Education Materials Provided: 06/21/2010 Safe sex practices discussed with patient. Condoms offered.                          Impression & Recommendations:  Problem # 1:  ABDOMINAL PAIN RIGHT UPPER QUADRANT (ICD-789.01) Assessment New Checking stat cbc, cmp, lipase, amylase, lactic acid . Differential could include mild gastroenteritis, constipation, to cholecystitis, diverticulitis or appendicitis though none of these are obvious from exam or histroy. If these labs are disturbing  or he feels worse will bring him back for CT abdomen and pelvis Orders: T-CBC w/Diff ([id number]) T-CMP with estimated GFR ([id number]) T-Lipase ([id number]) T- * Misc. Laboratory test ([id number]) T-Amylase [phone]) Est. Patient Level V ([phone])  Problem # 2:  HIV INFECTION (ICD-042) Assessment: Improved Excellent control! Orders: Est. Patient Level V (99215)Future Orders: T-CD4SP ([facility]) (CD4SP) ... 12/18/2010 T-HIV Viral Load [phone]) ... 12/18/2010 T-CBC w/Diff ([id number]) ... 12/18/2010 T-Comprehensive Metabolic Panel [phone]) ... 12/18/2010 T-Lipid Profile ([id number]) ... 12/18/2010  Diagnostics Reviewed:  HIV: CDC-defined AIDS (01/03/2010)   CD4: 320 (06/08/2010)   WBC: 5.6 (07/19/2009)   Hgb: 13.5 (07/19/2009)    HCT: 40.2 (07/19/2009)   Platelets: 235 (07/19/2009) HIV genotype: REPORT (10/21/2007)   HIV-1 RNA: <48 copies/mL (06/07/2010)   HBSAg: No (01/26/2007)  Problem # 3:  BENIGN PROSTATIC HYPERTROPHY, MILD, HX OF (ICD-V13.8)  continue meds  Orders: Est. Patient Level V (10932)  Problem # 4:  FEVER, HX OF (ICD-V15.9)  see above discussion concernign possible abdomianl pathology. He also coul have URI with coexisstant constipation. will fu labs from today that I ordered stat  Orders: Est. Patient Level V (35573)  Problem # 5:  HYPERLIPIDEMIA, WITH LOW HDL (ICD-272.4)  At goal His updated medication list for this problem includes:    Pravachol 40 Mg Tabs (Pravastatin sodium) .Marland Kitchen... Take 1 tablet by mouth once a day  Labs Reviewed: SGOT: 19 (06/07/2010)   SGPT: 17 (06/07/2010)  Lipid Goals: Chol Goal: 200 (04/20/2009)   HDL Goal: 40 (04/20/2009)   LDL Goal: 130 (04/20/2009)   TG Goal: 150 (04/20/2009)  Prior 10 Yr Risk Heart Disease: 14 % (04/20/2009)   HDL:30 (06/07/2010), 30 (11/15/2008)  LDL:112 (06/07/2010), 111 (11/15/2008)  Chol:181 (06/07/2010), 164 (11/15/2008)  Trig:194 (06/07/2010), 116 (11/15/2008)  Orders: Est. Patient Level V (22025)  Medications Added to Medication List This Visit: 1)  Finasteride 5 Mg Tabs (Finasteride) .... Take 1 tablet by mouth once a day  Patient Instructions: 1)  We will get some stat labs today. 2)  I will callyou if they are abnormal and wee need to do further testing such as CT scan etc 3)  If your abdominal pain worsens or fever worsens come back to the Emergency Dept 4)  Please also make fu appt with me 6 months for overall care   Prescriptions: FINASTERIDE 5 MG  TABS (FINASTERIDE) Take 1 tablet by mouth once a day  #30 x 11   Entered and Authorized by:   Acey Lav MD   Signed by:   Paulette Blanch Dam MD on 06/21/2010   Method used:   Electronically to        [facility] [address] [facility] [address] [phone]* (retail)       [address]       [place], [place]  [address]       Ph: [phone] or [phone]       Fax: [phone]   RxID:   [id number] ATRIPLA 600-200-300 MG TABS (EFAVIRENZ-EMTRICITAB-TENOFOVIR) once daily  #90 x 3   Entered and Authorized by:   Acey Lav MD   Signed by:   Paulette Blanch Dam MD on 06/21/2010   Method used:   [facility] to        [facility] [address] [phone]* (retail)       [address]       [place], [place]  [address]       Ph: [phone] or [phone]       Fax: [phone]   RxID:   [id number] PRAVACHOL 40 MG TABS (PRAVASTATIN SODIUM) Take 1 tablet by mouth once a day  #90 x 12   Entered and Authorized by:   Acey Lav MD   Signed by:   Paulette Blanch Dam MD on 06/21/2010   Method used:   [facility]        [facility] [address] [phone]* (retail)       [address]       [place], [place]  [address]       Ph: [phone] or [phone]       Fax: [phone]   RxID:   [id number]

## 2011-01-03 NOTE — Assessment & Plan Note (Signed)
Summary: Followup biliary leak/stent post hospital   History of Present Illness Visit Type: new patient  Primary GI MD: Yancey Flemings MD Primary Provider: na Requesting Provider: na Chief Complaint: Hosp f/u for biliary stent, and gallbladder removal. Pt c/o GERD, and hemorrhoids  History of Present Illness:   65 year old with HIV disease, hyperlipidemia, and BPH. He was hospitalized in July with acute cholecystitis. He subsequently underwent ERCP with biliary stent placement for postoperative bile duct leak June 26, 2010. He did well post procedure and was discharged home. He tells me that about 2 weeks ago his percutaneous JP drain was removed. No problems thereafter. He presents today for routine followup. GI review of systems is negative except for occasional heartburn.   GI Review of Systems    Reports acid reflux, heartburn, and  loss of appetite.      Denies abdominal pain, belching, bloating, chest pain, dysphagia with liquids, dysphagia with solids, nausea, vomiting, vomiting blood, weight loss, and  weight gain.      Reports hemorrhoids.     Denies anal fissure, black tarry stools, change in bowel habit, constipation, diarrhea, diverticulosis, fecal incontinence, heme positive stool, irritable bowel syndrome, jaundice, light color stool, liver problems, rectal bleeding, and  rectal pain.    Current Medications (verified): 1)  Atripla 600-200-300 Mg Tabs (Efavirenz-Emtricitab-Tenofovir) .... Once Daily 2)  Finasteride 5 Mg  Tabs (Finasteride) .... Take 1 Tablet By Mouth Once A Day 3)  Pravachol 40 Mg Tabs (Pravastatin Sodium) .... Take 1 Tablet By Mouth Once A Day 4)  Aleve 220 Mg Tabs (Naproxen Sodium) .... 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  The patient denies allergy/sinus, anemia, anxiety-new, arthritis/joint pain, back pain, blood in urine, breast changes/lumps, change in vision, confusion, cough, coughing up blood, depression-new, fainting, fatigue, fever, hearing problems, heart murmur, heart rhythm changes, itching, muscle pains/cramps, night sweats, nosebleeds, shortness of breath, skin rash, sleeping problems, sore throat, swelling of feet/legs, swollen lymph glands, thirst - excessive, urination - excessive, urination changes/pain, urine leakage, vision changes, and voice change.    Vital Signs:  Patient profile:   65 year old male Height:      68.5 inches Weight:      195 pounds BMI:     29.32 BSA:     2.03 Pulse rate:   88 / minute Pulse rhythm:   regular BP sitting:   120 / 72  (left arm) Cuff size:   regular  Vitals Entered By: Ok Anis CMA (July 30, 2010 8:31 AM)  Physical Exam  General:  Well developed, well nourished, no acute distress. Head:  Normocephalic and atraumatic. Eyes:  PERRLA, no icterus. Ears:  Normal auditory acuity. Nose:  No deformity, discharge,  or lesions. Mouth:  No deformity or lesions Neck:  Supple; no masses or thyromegaly. Lungs:   Clear throughout to auscultation. Heart:  Regular rate and rhythm; no murmurs, rubs,  or bruits. Abdomen:  Soft, nontender and nondistended. No masses, hepatosplenomegaly or hernias noted. Normal bowel sounds. Pulses:  Normal pulses noted. Extremities:  no edema Neurologic:  alert and oriented Skin:  no jaundice Psych:  Alert and cooperative. Normal mood and affect.   Impression & Recommendations:  Problem # 1:  PERFORATION OF BILE DUCT (ICD-576.3) history of acute cholecystitis complicated by bile duct leak status post ERCP with biliary stent placement June 26, 2010. Clinically asymptomatic post removal of JP drain  Plan #1. ERCP with biliary stent removal. The nature of the procedure as well as the risks, benefits, and alternatives were reviewed. He understood and agreed to proceed #2. Cipro 400 mg IV preprocedure  Other Orders: ERCP (ERCP)  Patient Instructions: 1)  ERCP WITH STENT REMOVAL [facility] 08/09/10 1:15 PM ARRIVE AT 12:15 PM 2)  CIPRO 400 MG IV X 1 PRIOR TO PROCEDURE. 3)  The medication list was reviewed and reconciled.  All changed / newly prescribed medications were explained.  A complete medication list was provided to the patient / caregiver. 4)  Copy: Dr. Jimmye Norman, Dr. Paulette Blanch [facility]

## 2011-01-03 NOTE — Procedures (Signed)
Summary: Instructions for procedure/[place]  Instructions for procedure/[place]   Imported By: Sherian Rein 08/07/2010 07:21:04  _____________________________________________________________________  External Attachment:    Type:   Image     Comment:   External Document

## 2011-01-03 NOTE — Progress Notes (Signed)
----   Converted from flag ---- ---- 11/08/2010 9:26 AM, Darcey Nora RN, CGRN wrote: needs lab work/orders are in IDX  needs lfts monthly ------------------------------  Phone Note Outgoing Call   Call placed by: Milford Cage NCMA,  December 17, 2010 9:27 AM Call placed to: Patient Summary of Call: called patient to remind him that it is time for his LFTs.  He will come sometime this week. Initial call taken by: Milford Cage NCMA,  December 17, 2010 9:27 AM

## 2011-01-03 NOTE — Assessment & Plan Note (Signed)
Summary: fukam   Visit Type:  Follow-up Primary Provider:  Paulette Blanch Dam MD  CC:  hsfu.  History of Present Illness: 65 yo  [place] male with HIV, BPH, hyperlididemia on Atripla, whom we found to have a perforated gallbladder during his admisison to [facility] a few weeks ago. He was managed with usasyn, then zosyn and is sp lap cholecystecomty by [facility]. with placemen of stent as well by GI and changed to augmentin. He also had a biliary stent placed which is to be removed at the end of the month. He has been doing well since dc from the hospital. Today he has minimal pain that is responding to vicodin when needed once or twice a day.     Dr. Marina Goodell ([facility]). Dr.Wyatt ([facility]s)  Problems Prior to Update: 1)  Acute Cholecystitis  (ICD-575.0) 2)  Fever, Hx of  (ICD-V15.9) 3)  Abdominal Pain Right Upper Quadrant  (ICD-789.01) 4)  Special Screening For Malignant Neoplasms Colon  (ICD-V76.51) 5)  Need Prophylactic Vaccination&inoculation Flu  (ICD-V04.81) 6)  Health Screening  (ICD-V70.0) 7)  Hyperlipidemia, With Low Hdl  (ICD-272.4) 8)  High-risk Sexual Behavior  (ICD-V69.2) 9)  Benign Prostatic Hypertrophy, Mild, Hx of  (ICD-V13.8) 10)  Hx of Cryptococcosis  (ICD-117.5) 11)  Pneumocystis Pneumonia  (ICD-136.3) 12)  HIV Infection  (ICD-042)  Medications Prior to Update: 1)  Atripla 600-200-300 Mg Tabs (Efavirenz-Emtricitab-Tenofovir) .... Once Daily 2)  Finasteride 5 Mg  Tabs (Finasteride) .... Take 1 Tablet By Mouth Once A Day 3)  Pravachol 40 Mg Tabs (Pravastatin Sodium) .... Take 1 Tablet By Mouth Once A Day 4)  Omeprazole 40 Mg Cpdr (Omeprazole) .... Take 1 Tablet By Mouth Once A Day  Current Medications (verified): 1)  Atripla 600-200-300 Mg Tabs (Efavirenz-Emtricitab-Tenofovir) .... Once Daily 2)  Finasteride 5 Mg  Tabs (Finasteride) .... Take 1 Tablet By Mouth Once A Day 3)  Pravachol 40 Mg Tabs (Pravastatin Sodium) .... Take 1 Tablet By Mouth Once A Day 4)   Hydrocodone-Acetaminophen 5-500 Mg Tabs (Hydrocodone-Acetaminophen) .... Take 1 Tablet By Mouth Two Times A Day As Needed Pain  Allergies (verified): No Known Drug Allergies   Preventive Screening-Counseling & Management  Alcohol-Tobacco     Alcohol drinks/day: 0     Alcohol type: occassional beer     Smoking Status: never     Passive Smoke Exposure: no      Drug Use:  no.     Current Allergies (reviewed today): No known allergies  Past History:  Past Medical History: Last updated: 03/28/2008 HIV infection Pneumocystitis PNA Cryptococcus Pneumonia) BPH Hyperlipidemia  Past Surgical History: Last updated: 06/21/2010 None  Family History: Last updated: 11/09/2007 no early CAD  Social History: Last updated: 09/21/2007 Married Never Smoked Alcohol use-yes Drug use-no  Risk Factors: Alcohol Use: 0 (07/09/2010) Caffeine Use: 3 (08/22/2009) Exercise: no (08/22/2009)  Risk Factors: Smoking Status: never (07/09/2010) Passive Smoke Exposure: no (07/09/2010)  Family History: Reviewed history from 11/09/2007 and no changes required. no early CAD  Social History: Reviewed history from 09/21/2007 and no changes required. Married Never Smoked Alcohol use-yes Drug use-no  Review of Systems  The patient denies anorexia, fever, weight loss, weight gain, vision loss, decreased hearing, hoarseness, chest pain, syncope, dyspnea on exertion, peripheral edema, prolonged cough, headaches, hemoptysis, abdominal pain, melena, hematochezia, severe indigestion/heartburn, hematuria, incontinence, genital sores, muscle weakness, suspicious skin lesions, transient blindness, difficulty walking, depression, unusual weight change, abnormal bleeding, enlarged lymph nodes, and angioedema.    Vital Signs:  Patient profile:   65 year old male Height:      68.5 inches (173.99 cm) Weight:      198 pounds (90.00 kg) BMI:     29.78 Temp:     98.4 degrees F oral Pulse rate:   89 /  minute BP sitting:   133 / 76  (left arm)  Vitals Entered By: Starleen Arms CMA (July 09, 2010 8:48 AM) CC: hsfu Is Patient Diabetic? No Pain Assessment Patient in pain? no      Nutritional Status BMI of 25 - 29 = overweight Nutritional Status Detail nl  Does patient need assistance? Functional Status Self care Ambulation Normal   Physical Exam  General:  alert.  well-nourished.   Head:  normocephalic and atraumatic.  no maxillary tenderness Eyes:  vision grossly intact, pupils equal, pupils round,  Ears:  no external deformities.   Nose:  no external deformity.   Mouth:  pharynx pink and moist, no erythema, and no exudates.   Neck:  full ROM.   Lungs:  normal respiratory effort, no crackles, and no wheezes.   Heart:  normal rate, regular rhythm, no murmur, no gallop, and no rub.   Abdomen:  minimal tenderness diffusely, pos bowel sounds Msk:  normal ROM and no joint tenderness.   Extremities:  trace left pedal edema and 1+ left pedal edema.   Neurologic:  alert & oriented X3 and strength normal in all extremities.   Skin:  site of his former JP drain with minimal exudate in wound, no abscess palpable, or fluctuance Psych:  Oriented X3, memory intact for recent and remote, and normally interactive.          Medication Adherence: 07/09/2010   Adherence to medications reviewed with patient. Counseling to provide adequate adherence provided   Prevention For Positives: 07/09/2010   Safe sex practices discussed with patient. Condoms offered.   Education Materials Provided: 07/09/2010 Safe sex practices discussed with patient. Condoms offered.                          Impression & Recommendations:  Problem # 1:  ACUTE CHOLECYSTITIS (ICD-575.0)  Patient was found to have a perforated gallbaldder and is sp cholecystecomty and stent placment. Appears to be doing well . Gave him vicodin for any further pain next 10 days  Orders: Est. Patient Level IV  (09811)  Problem # 2:  HIV INFECTION (ICD-042) Has had superb control Orders: T-Comprehensive Metabolic Panel ([id number]) T-CBC w/Diff ([id number]) Est. 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 Take 1 tablet by mouth once a day  Labs Reviewed: SGOT: 54 (06/21/2010)   SGPT: 32 (06/21/2010)  Lipid Goals: Chol Goal: 200 (04/20/2009)   HDL Goal: 40 (04/20/2009)   LDL Goal: 130 (04/20/2009)   TG Goal: 150 (04/20/2009)  Prior 10 Yr Risk Heart Disease: 14 % (04/20/2009)   HDL:30 (06/07/2010), 30 (11/15/2008)  LDL:112 (06/07/2010), 111 (11/15/2008)  Chol:181 (06/07/2010), 164 (11/15/2008)  Trig:194 (06/07/2010), 116 (11/15/2008)  Orders: Est. Patient Level IV (28413)  Medications Added to Medication List This Visit: 1)  Hydrocodone-acetaminophen 5-500 Mg Tabs (Hydrocodone-acetaminophen) .... Take 1 tablet by mouth two times a day as needed pain  Patient Instructions: 1)  rtcin one month  Prescriptions: HYDROCODONE-ACETAMINOPHEN 5-500 MG TABS (HYDROCODONE-ACETAMINOPHEN) Take 1 tablet by mouth two times a day as needed pain  #40 x 0   Entered and Authorized by:   Acey Lav MD   Signed by:   Paulette Blanch Dam MD on 07/09/2010   Method used:   Print then Give to Patient   RxID:   [id number]

## 2011-01-03 NOTE — Procedures (Signed)
Summary: ERCP prep  ERCP prep   Imported By: Lester [place] 11/02/2010 09:34:54  _____________________________________________________________________  External Attachment:    Type:   Image     Comment:   External Document

## 2011-01-03 NOTE — Miscellaneous (Signed)
Summary: Lec previsit  Clinical Lists Changes  Observations: Added new observation of NKA: T (10/30/2010 12:51)

## 2011-01-03 NOTE — Assessment & Plan Note (Signed)
Summary: Post-ERCP (biliary stricture)   History of Present Illness Visit Type: Follow-up Visit Primary GI MD: Yancey Flemings MD Primary Provider: Acey Lav, M.D. Requesting Provider: na Chief Complaint: F/u from ERCP. Pt states that he is doing better and denies any GI complaints History of Present Illness:   65 year old with HIV disease, hyperlipidemia, and benign prostatic hypertrophy. Also, hospitalization in July with acute cholecystitis with subsequent cholecystectomy complicated by bile duct laceration and leak treated intraoperatively and subsequently with ERCP and stent placement. Patient has done well clinically. Routine followup ERCP August 09, 2010 demonstrated resolution of leak. However, high-grade stricture of the bile duct in the region of the cystic duct and clips. Thus, tandem 10 French stents placed. He presents now for followup. No clinical problems. In particular, no, pain, fevers, or jaundice.   GI Review of Systems      Denies abdominal pain, acid reflux, belching, bloating, chest pain, dysphagia with liquids, dysphagia with solids, heartburn, loss of appetite, nausea, vomiting, vomiting blood, weight loss, and  weight gain.        Denies anal fissure, black tarry stools, change in bowel habit, constipation, diarrhea, diverticulosis, fecal incontinence, heme positive stool, hemorrhoids, irritable bowel syndrome, jaundice, light color stool, liver problems, rectal bleeding, and  rectal pain.    Current Medications (verified): 1)  Atripla 600-200-300 Mg Tabs (Efavirenz-Emtricitab-Tenofovir) .... Once Daily 2)  Finasteride 5 Mg  Tabs (Finasteride) .... Take 1 Tablet By Mouth Once A Day 3)  Pravachol 40 Mg Tabs (Pravastatin Sodium) .... Take 1 Tablet By Mouth Once A Day 4)  Aleve 220 Mg Tabs (Naproxen Sodium) .... As Needed  Allergies (verified): No Known Drug Allergies  Past History:  Past Medical History: Reviewed history from 07/30/2010 and no changes  required. 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 Cholecystectomy--June 24, 2010   Family History: Reviewed history from 07/30/2010 and no changes required. 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 Retired Theatre stage manager Married 2 Childern Never Smoked Alcohol use-yes Drug use-no Daily Caffeine Use: one daily   Review of Systems  The patient denies allergy/sinus, anemia, anxiety-new, arthritis/joint pain, back pain, blood in urine, breast changes/lumps, change in vision, confusion, cough, coughing up blood, depression-new, fainting, fatigue, fever, headaches-new, hearing problems, heart murmur, heart rhythm changes, itching, menstrual pain, muscle pains/cramps, night sweats, nosebleeds, pregnancy symptoms, shortness of breath, skin rash, sleeping problems, sore throat, swelling of feet/legs, swollen lymph glands, thirst - excessive , urination - excessive , urination changes/pain, urine leakage, vision changes, and voice change.    Vital Signs:  Patient profile:   65 year old male Height:      68.5 inches Weight:      197 pounds BMI:     29.62 BSA:     2.04 Pulse rate:   76 / minute Pulse rhythm:   regular BP sitting:   116 / 64  (left arm) Cuff size:    regular  Vitals Entered By: Ok Anis  CMA (September 06, 2010 1:22 PM)  Physical Exam  General:  Well developed, well nourished, no acute distress. Head:  Normocephalic and atraumatic. Eyes:  PERRLA, no icterus. Mouth:  No deformity or lesions, dentition normal. Neck:  Supple; no masses or thyromegaly. Lungs:  Clear throughout to auscultation. Heart:  Regular rate and rhythm; no murmurs, rubs,  or bruits. Abdomen:  Soft, nontender and nondistended. No masses, hepatosplenomegaly or hernias noted. Normal bowel sounds. Pulses:  Normal pulses noted. Extremities:  no edema Neurologic:   alert and oriented Skin:  no jaundice Psych:  Alert and cooperative. Normal mood and affect.   Impression & Recommendations:  Problem # 1:  PERFORATION OF BILE DUCT (ICD-576.3) Postoperative bile duct leak status post ERCP with stent placement and subsequent resolution of leak.  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 Status post placement of tandem 10 French biliary stents. Hope for long-term improvement in stricture with endoscopic therapy. Discussed with the patient the background and rationale for using long-term (6-12 months) stenting for benign biliary strictures. Discussed possible problems with biliary stricturing including obstruction with jaundice or cholangitis. Discussed surgical therapy for this problem as well.  Plan: #1. LFTs today and monthly #2. Advised with regards for signs or symptoms to suggest biliary obstruction. Contact the office, on call personnel, or go to the hospital if such were to occur #3. Plan elective reevaluation of bile duct sometime in December.  Other Orders: TLB-Hepatic/Liver Function Pnl ([id number])  Patient Instructions: 1)  LFTs today and then once monthly. 2)  We will schedule you for your ERCP in December as soon as the schedule is available.  We will then call you with date and time. 3)   The medication list was reviewed and reconciled.  All changed / newly prescribed medications were explained.  A complete medication list was provided to the patient / caregiver. 4)  Copy: Dr. Paulette Blanch Dam, Dr. Jimmye Norman

## 2011-01-03 NOTE — Miscellaneous (Signed)
Summary: RW Update  Clinical Lists Changes  Observations: Added new observation of HIV STATUS: CDC-defined AIDS (01/03/2010 16:27)

## 2011-01-03 NOTE — Progress Notes (Signed)
Summary: Follow up hospital orders  ---- Converted from flag ---- ---- 11/08/2010 8:18 AM, Hilarie Fredrickson MD wrote: Valentino Hue. He needs LFT's monthly and repeat ERCP in 3-4 months  ---- 11/07/2010 4:27 PM, Darcey Nora RN, CGRN wrote: Dr Marina Goodell do you want me to put in for LFTs next month?  Does he need ERCP, MRCP, or office visit recall in 3 months.  Please advise ------------------------------  Phone Note From Other Clinic Call back at [facility] Phone [phone]   Summary of Call: I have entered a recall in IDX for labs on 12/09/10, recall also entered in IDX for repeat ERCP in March or April.  I will also send a reminder flag. Initial call taken by: Darcey Nora RN, CGRN,  November 08, 2010 9:30 AM

## 2011-01-03 NOTE — Initial Assessments (Signed)
Summary: Hospital Admission 08/22/10  -INTERNAL MEDICINE ADMISSION HISTORY AND PHYSICAL DO NOT REMOVE FROM PROGRESS NOTES   Intern: Dr. Saralyn Pilar [phone] R3: Dr. Ishmael Holter [phone]  Attending: Dr. Josem Kaufmann PCP:Dr. Daiva Eves CC: Abdominal pain  HPI: 65 yo African American male with HIV (CD4 320, 7/11), BPH, hyperlipidemia on Atripla who presents for routine followup to clinic today.  At that time he related that he had an event of substantial abdominal over the weekend that he thought was due to "gas" (he felt bloated at the time). The pain was periumbilical, not associated with meals, and described as achy and 8-9/10 in intensity. The patient took ibuprofen and an antiacid and had improvement in the pain. The pain resolved during the week however, the pain redeveloped on Thursday but was much milder 3/10. At the clinic, he was found to have a low grade temperature of 100, and on exam was tender in the right upper quadrant without rebound.  Given his GI symptoms cbc, cmp, lipase, amylase, and lactic acid were checked.  Lab results showed the patient to have an elevated bun/creat (25/2.19 up from 15/1.26 06/07/10), a leukocytosis of 16.5, anemia and elevated liver enzymes. The patient currently denies any dysuria or suprapubic pain, nausea/vomiting, chills, fever at home, light headedness, dizziness, chest pain, blood in the stool/dark stools, hematuria or SOB.    ALLERGIES: NKDA  PAST MEDICAL HISTORY: HIV infection diagnosed 2006, cd4 320 7/11, Undetectable HIV viral load Pneumocystitis PNA Cryptococcus Pneumonia) BPH Hyperlipidemia   MEDICATIONS: Current Meds:  ATRIPLA 600-200-300 MG TABS (EFAVIRENZ-EMTRICITAB-TENOFOVIR) once daily FINASTERIDE 5 MG  TABS (FINASTERIDE) Take 1 tablet by mouth once a day PRAVACHOL 40 MG TABS (PRAVASTATIN SODIUM) Take 1 tablet by mouth once a day   SOCIAL HISTORY: Social History: Married with two children Now retired Never Smoked Alcohol  use-occasional Drug use-no    FAMILY HISTORY Family History: no early CAD  Father died of throat Ca in 34's Sister with bone CA Sister died of unknown CA  XBJ:YNWGNFAO as per HPI  VITALS: T:98.5  P:84  BP:134/77  R:14  O2SAT: 96%  ON:RA  PHYSICAL EXAM: General:  alert, well-developed, and cooperative to examination.   Head:  normocephalic and atraumatic.   Eyes:  vision grossly intact, pupils equal, pupils round, pupils reactive to light, no injection and anicteric.   Mouth:  pharynx pink and moist, no erythema, and no exudates.   Neck:  supple, full ROM, no thyromegaly, no JVD, and no carotid bruits.   Lungs:  normal respiratory effort, no accessory muscle use, normal breath sounds, no crackles, and no wheezes.  Heart:  normal rate, regular rhythm, no murmur, no gallop, and no rub.   Abdomen:  soft, mildly obese, mild tenderness in the right upper quadrant, normal bowel sounds, no distention, no guarding, no rebound tenderness, no hepatomegaly, and no splenomegaly.  Pt has both a ventral and umbilical hernia (1cm wall defect) Msk:  no joint swelling, no joint warmth, and no redness over joints.   Pulses:  2+ DP/PT pulses bilaterally Extremities:  No cyanosis, clubbing, edema  Neurologic:  alert & oriented X3, cranial nerves II-XII intact, strength normal in all extremities, sensation intact to light touch, and gait normal.   Skin:  turgor normal and no rashes.   Psych:  Oriented X3, memory intact for recent and remote, normally interactive, good eye contact, not anxious appearing, and not depressed appearing.   LABS: Sodium  135 mEq/L                   135-145   Potassium            [L]  3.4 mEq/L                   3.5-5.3   Chloride                  102 mEq/L                   96-112   CO2                       20 mEq/L                    19-32   Glucose              [H]  165 mg/dL                   81-19   BUN                  [H]  25 mg/dL                     1-47   Creatinine           [H]  2.19 mg/dL                  0.40-1.50     Result repeated and verified.   Bilirubin, Total          0.4 mg/dL                   8.2-9.5   Alkaline Phosphatase [H]  145 U/L                     39-117   AST/SGOT             [H]  54 U/L                      0-37   ALT/SGPT                  32 U/L                      0-53   Total Protein             7.6 g/dL                    6.2-1.3   Albumin                   3.5 g/dL                    0.8-6.5   Calcium              [L]  8.2 mg/dL                   7.8-46.9 ! Est GFR, African American                        [L]  37 mL/min                   >60 ! Est GFR, NonAfrican American                        [  L]  31 mL/min                   >60  Tests: (2) CBC with Diff (10010)   WBC                  [H]  16.3 K/uL                   4.0-10.5   RBC                  [L]  4.01 MIL/uL                 4.22-5.81   Hemoglobin           [L]  11.7 g/dL                   04.5-40.9   Hematocrit           [L]  34.6 %                      39.0-52.0   MCV                       86.3 fL                     78.0-100.0 ! MCH                       29.2 pg   MCHC                      33.8 g/dL                   81.1-91.4   RDW                       14.5 %                      11.5-15.5   Platelet Count            307 K/uL                    150-400   Granulocyte %        [H]  88 %                        43-77   Absolute Gran        [H]  14.3 K/uL                   1.7-7.7   Lymph %              [L]  6 %                         12-46   Absolute Lymph            0.9 K/uL                    0.7-4.0   Mono %                    6 %                         3-12  Absolute Mono             1.0 K/uL                    0.1-1.0   Eos %                     0 %                         0-5   Absolute Eos              0.0 K/uL                    0.0-0.7   Baso %                    0 %                         0-1   Absolute Baso             0.0  K/uL                    0.0-0.1   Smear Review       RESULT: Criteria for review not met  Tests: (3) Amylase (30865)   Amylase                   27 U/L                      0-105  Tests: (4) Lipase (78469)   Lipase                    31 U/L                      0-75  Tests: (5) Lactic Acid (62952) ! Lactic Acid               1.6 mmol/L                  0.5-2.2  STUDIES: Chest X-ray:  Findings:  Heart size is normal.  There is no pleural effusion.   Previous bilateral airspace opacities have resolved in the interval.    IMPRESSION:   Interval resolution of bilateral pneumonia.   ASSESSMENT AND PLAN: This is a 65 year old male with well controlled HIV and BPH who presents with abdominal pain associated with a leukocytosis and acute kidney injury.  1. Epigastric pain: Likely due to an inflammatory process given the patients slight hyperthermia and elevated white count.  DDX at this point includes cholecystitis vs. appendicitis vs. diverticulitis.  At this point, pt is stable with only mild tenderness to palpation.  A CT scan with oral and IV contrast would be helpful in determining the most likely cause of the patients pain, however, this limited by his AKI.  Will hydrate heavily overnight and recheck BMP in AM to reassess.  The patients history of pain 5 days ago which resolved until today makes either appendicitis or cholecystitis less likely, even given the patients elevated alk phos.  It is possible that the patient could have diverticulitis, however, we need to attain past colonoscopy records. Will check blood cultures x2 even though the patient does not currently look septic. Will also initiate antibiotic coverage with Unasyn.  2. AKI- At this point  we are considering prerenal verses post renal causes of the patient AKI.  A prerenal etiology could be the result of decreased oral intake in the setting of the patients abdominal pain, though  the short term nature of the pain challenges the  diagnosis.  The patient also has a history of BPH therefore, bilateral obstruction could result in a similar picture.  The plan for now is to check urine NA and Creatinine so and calculate a fractional excretion of NA.  Will also get followup BMP in AM to rule out lab error.  The patient will receive a bladder scan 3 hours from admission check for obstruction.Will hydrate patient with 250cc/h x12 hours in order to try to improve renal function.  3. Anemia: Pts Hgb was 13-14 2007-07/2009. Will check anemia panel, FOBT x 3. 4. HIV: See by Dr. Daiva Eves yesterday who noted that the patients HIV was under excellent control.  The patients last cd4 320 7/11, with an undetectable HIV viral load.  There are no active issues currently and the patient will continue to followup as an outpatient.   5. HLD: pt has elevated LDL and Triglycerides with a low HDL at last check 06/07/10.  Will continue pravastatin as patient has not had any adverse side effects.  Pt should be encouraged to maintain a healthy diet with exersise.   6. Prophylaxis: SCD's       ()DEPRESSION/ANXIETY: Continue home medications ()VTE PROPH: lovenox

## 2011-01-14 ENCOUNTER — Other Ambulatory Visit: Payer: Self-pay

## 2011-01-23 ENCOUNTER — Encounter: Payer: Self-pay | Admitting: Infectious Disease

## 2011-02-01 ENCOUNTER — Telehealth: Payer: Self-pay | Admitting: Internal Medicine

## 2011-02-04 ENCOUNTER — Encounter: Payer: Self-pay | Admitting: Internal Medicine

## 2011-02-12 NOTE — Miscellaneous (Signed)
Summary: Orders Update  Clinical Lists Changes  Orders: Added new Test order of ERCP (ERCP) - Signed

## 2011-02-12 NOTE — Progress Notes (Signed)
Summary: Schedule ERCP   ---- Converted from flag ---- ---- 11/08/2010 9:31 AM, Darcey Nora RN, CGRN wrote: patient will need an ERCP to check stent placement either in March or April ------------------------------  Phone Note Outgoing Call   Call placed by: Milford Cage [place],  February 01, 2011 2:41 PM Call placed to: Patient Summary of Call: Called patient to see what day would be good for him the last week in March [facility] week).  Left message for him to return my call to schedule ERCP to check his stent placement.   Initial call taken by: Milford Cage [place],  February 01, 2011 2:42 PM  Follow-up for Phone Call        Pt. returned my call and ERCP was scheduled for Monday 02/25/11 @8 :30 am.  all instructions were mailed to patient as he has had it several times in the past.  Advised him to call if he had any questions at all.  He insisted he did not want/need to come in for Pre-visit that he fully understood the process.   Follow-up by: Milford Cage NCMA,  February 04, 2011 1:57 PM

## 2011-02-12 NOTE — Letter (Signed)
Summary: EGD Instructions  [facility]  [address] [place], [place] [address]   Phone: [phone]  Fax: [phone]       Jay Hoffman    May 16, 1946    MRN: [id number]       Procedure Day /Date:MONDAY 02/25/11     Arrival Time: 7:30 AM     Procedure Time:8:30 AM     Location of Procedure:                     [facility] ( Outpatient Registration)   PREPARATION FOR ENDOSCOPY WITH STENT PLACEMENT CHECK   On MONDAY 02/25/11 THE DAY OF THE PROCEDURE:  1.   No solid foods, milk or milk products are allowed after midnight the night before your procedure.  2.   Do not drink anything colored red or purple.  Avoid juices with pulp.  No orange juice.  3.  You may drink clear liquids until 4:30 AM, which is 4 hours before your procedure.                                                                                                CLEAR LIQUIDS INCLUDE: Water Jello Ice Popsicles Tea (sugar ok, no milk/cream) Powdered fruit flavored drinks Coffee (sugar ok, no milk/cream) Gatorade Juice: apple, white grape, white cranberry  Lemonade Clear bullion, consomm, broth Carbonated beverages (any kind) Strained chicken noodle soup Hard Candy   MEDICATION INSTRUCTIONS  Unless otherwise instructed, you should take regular prescription medications with a small sip of water as early as possible the morning of your procedure.           OTHER INSTRUCTIONS  You will need a responsible adult at least 65 years of age to accompany you and drive you home.   This person must remain in the waiting room during your procedure.  Wear loose fitting clothing that is easily removed.  Leave jewelry and other valuables at home.  However, you may wish to bring a book to read or an iPod/MP3 player to listen to music as you wait for your procedure to start.  Remove all body piercing jewelry and leave at home.  Total time from sign-in until discharge is approximately  2-3 hours.  You should go home directly after your procedure and rest.  You can resume normal activities the day after your procedure.  The day of your procedure you should not:   Drive   Make legal decisions   Operate machinery   Drink alcohol   Return to work  You will receive specific instructions about eating, activities and medications before you leave.    The above instructions have been reviewed and explained to me by   _______________________    I fully understand and can verbalize these instructions _____________________________ Date _________    INSTRUCTIONS PRINTED AND MAILED TO PATIENT. ALONG WITH ERCP INSTRUCTIONS ATTN: JILL   BOOK#  [id number]  @[facility] 

## 2011-02-15 ENCOUNTER — Telehealth: Payer: Self-pay | Admitting: Internal Medicine

## 2011-02-16 LAB — COMPREHENSIVE METABOLIC PANEL
ALT: 26 U/L (ref 0–53)
ALT: 31 U/L (ref 0–53)
ALT: 35 U/L (ref 0–53)
ALT: 51 U/L (ref 0–53)
AST: 23 U/L (ref 0–37)
Albumin: 2.1 g/dL — ABNORMAL LOW (ref 3.5–5.2)
Albumin: 2.2 g/dL — ABNORMAL LOW (ref 3.5–5.2)
Alkaline Phosphatase: 79 U/L (ref 39–117)
Alkaline Phosphatase: 84 U/L (ref 39–117)
Alkaline Phosphatase: <5 U/L — ABNORMAL LOW (ref 39–117)
BUN: 16 mg/dL (ref 6–23)
CO2: 24 mEq/L (ref 19–32)
Calcium: 8.2 mg/dL — ABNORMAL LOW (ref 8.4–10.5)
Calcium: 8.6 mg/dL (ref 8.4–10.5)
Chloride: 103 mEq/L (ref 96–112)
Chloride: 104 mEq/L (ref 96–112)
Creatinine, Ser: 1.48 mg/dL (ref 0.4–1.5)
GFR calc Af Amer: 58 mL/min — ABNORMAL LOW (ref >60)
GFR calc Af Amer: >60 mL/min (ref >60)
GFR calc non Af Amer: 46 mL/min — ABNORMAL LOW (ref >60)
GFR calc non Af Amer: 48 mL/min — ABNORMAL LOW (ref >60)
Glucose, Bld: 105 mg/dL — ABNORMAL HIGH (ref 70–99)
Glucose, Bld: 82 mg/dL (ref 70–99)
Potassium: 3.2 mEq/L — ABNORMAL LOW (ref 3.5–5.1)
Potassium: 4.3 mEq/L (ref 3.5–5.1)
Sodium: 138 mEq/L (ref 135–145)
Sodium: 140 mEq/L (ref 135–145)
Sodium: 141 mEq/L (ref 135–145)
Total Protein: 7 g/dL (ref 6.0–8.3)
Total Protein: 7 g/dL (ref 6.0–8.3)
Total Protein: 7.2 g/dL (ref 6.0–8.3)

## 2011-02-16 LAB — DIFFERENTIAL
Basophils Relative: 0 % (ref 0–1)
Eosinophils Absolute: 0 10*3/uL (ref 0.0–0.7)
Eosinophils Absolute: 0.1 10*3/uL (ref 0.0–0.7)
Lymphocytes Relative: 13 % (ref 12–46)
Lymphs Abs: 1.5 10*3/uL (ref 0.7–4.0)
Lymphs Abs: 1.8 10*3/uL (ref 0.7–4.0)
Monocytes Relative: 5 % (ref 3–12)
Monocytes Relative: 8 % (ref 3–12)
Monocytes Relative: 9 % (ref 3–12)
Neutro Abs: 11.1 10*3/uL — ABNORMAL HIGH (ref 1.7–7.7)
Neutro Abs: 9.1 10*3/uL — ABNORMAL HIGH (ref 1.7–7.7)
Neutrophils Relative %: 78 % — ABNORMAL HIGH (ref 43–77)
Neutrophils Relative %: 78 % — ABNORMAL HIGH (ref 43–77)
Neutrophils Relative %: 89 % — ABNORMAL HIGH (ref 43–77)

## 2011-02-16 LAB — BASIC METABOLIC PANEL
CO2: 23 mEq/L (ref 19–32)
Calcium: 8.7 mg/dL (ref 8.4–10.5)
Calcium: 8.8 mg/dL (ref 8.4–10.5)
Chloride: 104 mEq/L (ref 96–112)
Creatinine, Ser: 1.97 mg/dL — ABNORMAL HIGH (ref 0.4–1.5)
Creatinine, Ser: 2.05 mg/dL — ABNORMAL HIGH (ref 0.4–1.5)
GFR calc Af Amer: 42 mL/min — ABNORMAL LOW (ref >60)
GFR calc non Af Amer: 35 mL/min — ABNORMAL LOW (ref >60)
Glucose, Bld: 121 mg/dL — ABNORMAL HIGH (ref 70–99)
Sodium: 137 mEq/L (ref 135–145)

## 2011-02-16 LAB — LIPASE, BLOOD: Lipase: 36 U/L (ref 11–59)

## 2011-02-16 LAB — CBC
HCT: 33.9 % — ABNORMAL LOW (ref 39.0–52.0)
HCT: 33.9 % — ABNORMAL LOW (ref 39.0–52.0)
Hemoglobin: 10.8 g/dL — ABNORMAL LOW (ref 13.0–17.0)
Hemoglobin: 11.2 g/dL — ABNORMAL LOW (ref 13.0–17.0)
MCH: 29.9 pg (ref 26.0–34.0)
MCH: 30.3 pg (ref 26.0–34.0)
MCHC: 32.8 g/dL (ref 30.0–36.0)
MCHC: 33.6 g/dL (ref 30.0–36.0)
Platelets: 322 10*3/uL (ref 150–400)
Platelets: 328 10*3/uL (ref 150–400)
Platelets: 342 10*3/uL (ref 150–400)
Platelets: 383 10*3/uL (ref 150–400)
RBC: 3.26 MIL/uL — ABNORMAL LOW (ref 4.22–5.81)
RBC: 3.62 MIL/uL — ABNORMAL LOW (ref 4.22–5.81)
RDW: 13.7 % (ref 11.5–15.5)
RDW: 14.1 % (ref 11.5–15.5)
WBC: 13.5 10*3/uL — ABNORMAL HIGH (ref 4.0–10.5)
WBC: 14.2 10*3/uL — ABNORMAL HIGH (ref 4.0–10.5)
WBC: 16 10*3/uL — ABNORMAL HIGH (ref 4.0–10.5)

## 2011-02-16 LAB — URINE MICROSCOPIC-ADD ON

## 2011-02-16 LAB — AMYLASE: Amylase: 63 U/L (ref 0–105)

## 2011-02-16 LAB — URINALYSIS, ROUTINE W REFLEX MICROSCOPIC
Glucose, UA: NEGATIVE mg/dL
Leukocytes, UA: NEGATIVE
Protein, ur: 30 mg/dL — AB
Specific Gravity, Urine: 1.024 (ref 1.005–1.030)
Urobilinogen, UA: 0.2 mg/dL (ref 0.0–1.0)

## 2011-02-16 LAB — CULTURE, BLOOD (ROUTINE X 2)
Culture: NO GROWTH
Culture: NO GROWTH

## 2011-02-16 LAB — MRSA PCR SCREENING: MRSA by PCR: NEGATIVE

## 2011-02-17 LAB — T-HELPER CELL (CD4) - (RCID CLINIC ONLY): CD4 T Cell Abs: 320 uL — ABNORMAL LOW (ref 400–2700)

## 2011-02-18 ENCOUNTER — Other Ambulatory Visit: Payer: Self-pay | Admitting: Internal Medicine

## 2011-02-18 ENCOUNTER — Other Ambulatory Visit: Payer: 59

## 2011-02-18 ENCOUNTER — Encounter (INDEPENDENT_AMBULATORY_CARE_PROVIDER_SITE_OTHER): Payer: Self-pay | Admitting: *Deleted

## 2011-02-18 DIAGNOSIS — K810 Acute cholecystitis: Secondary | ICD-10-CM

## 2011-02-18 LAB — HEPATIC FUNCTION PANEL
ALT: 36 U/L (ref 0–53)
AST: 38 U/L — ABNORMAL HIGH (ref 0–37)
Alkaline Phosphatase: 168 U/L — ABNORMAL HIGH (ref 39–117)
Bilirubin, Direct: 0.1 mg/dL (ref 0.0–0.3)
Total Protein: 7.6 g/dL (ref 6.0–8.3)

## 2011-02-19 NOTE — Progress Notes (Signed)
Summary: sprak to nurse  Phone Note Call from Patient Call back at [phone]   Reason for Call: Talk to Nurse Summary of Call: Patient would like to speak to nurse regarding labs  Initial call taken by: Tawni Levy,  February 15, 2011 11:32 AM  Follow-up for Phone Call        Patient wanting to know if he needs labs prior to his ERCP. Per Dr. Marina Goodell patient should have monthly LFT's. Appt made for patient to have labs 02/18/11. Patient aware of appointment date and time. Follow-up by: Selinda Michaels RN,  February 15, 2011 11:49 AM

## 2011-02-25 ENCOUNTER — Ambulatory Visit (HOSPITAL_COMMUNITY): Payer: 59

## 2011-02-25 ENCOUNTER — Ambulatory Visit (HOSPITAL_COMMUNITY)
Admission: RE | Admit: 2011-02-25 | Discharge: 2011-02-25 | Disposition: A | Discharge status: Home / Self Care | Payer: 59 | Source: Ambulatory Visit | Attending: Internal Medicine | Admitting: Internal Medicine

## 2011-02-25 ENCOUNTER — Other Ambulatory Visit: Payer: Self-pay | Admitting: Internal Medicine

## 2011-02-25 DIAGNOSIS — Z4689 Encounter for fitting and adjustment of other specified devices: Secondary | ICD-10-CM | POA: Insufficient documentation

## 2011-02-25 DIAGNOSIS — E785 Hyperlipidemia, unspecified: Secondary | ICD-10-CM | POA: Insufficient documentation

## 2011-02-25 DIAGNOSIS — Z9089 Acquired absence of other organs: Secondary | ICD-10-CM | POA: Insufficient documentation

## 2011-02-25 DIAGNOSIS — K831 Obstruction of bile duct: Secondary | ICD-10-CM

## 2011-02-25 DIAGNOSIS — N400 Enlarged prostate without lower urinary tract symptoms: Secondary | ICD-10-CM | POA: Insufficient documentation

## 2011-02-25 DIAGNOSIS — Z21 Asymptomatic human immunodeficiency virus [HIV] infection status: Secondary | ICD-10-CM | POA: Insufficient documentation

## 2011-02-26 ENCOUNTER — Telehealth: Payer: Self-pay

## 2011-02-26 DIAGNOSIS — K831 Obstruction of bile duct: Secondary | ICD-10-CM

## 2011-02-26 NOTE — Telephone Encounter (Signed)
Message copied by Chrystie Nose on Tue Feb 26, 2011  3:21 PM ------      Message from: Yancey Flemings      Created: Mon Feb 25, 2011  4:33 PM       Bonita Quin , I did an ERCP on Jay Hoffman today and removed his biliary stents. He needs LFTs drawn once monthly. Also, an office appointment with me in about 3-4 months. Thanks

## 2011-02-26 NOTE — Telephone Encounter (Signed)
Patient notified that labs are to be drawn monthly and start in April.

## 2011-02-28 ENCOUNTER — Other Ambulatory Visit (INDEPENDENT_AMBULATORY_CARE_PROVIDER_SITE_OTHER): Payer: 59

## 2011-02-28 ENCOUNTER — Telehealth: Payer: Self-pay | Admitting: Internal Medicine

## 2011-02-28 DIAGNOSIS — Z9889 Other specified postprocedural states: Secondary | ICD-10-CM

## 2011-02-28 DIAGNOSIS — R7989 Other specified abnormal findings of blood chemistry: Secondary | ICD-10-CM

## 2011-02-28 LAB — HEPATIC FUNCTION PANEL
Bilirubin, Direct: 0.4 mg/dL — ABNORMAL HIGH (ref 0.0–0.3)
Total Bilirubin: 1 mg/dL (ref 0.3–1.2)

## 2011-02-28 NOTE — Telephone Encounter (Signed)
Spoke with pt and he states he is feeling better. Pt knows to finish taking all of the cipro and to come for labs today and next week.

## 2011-02-28 NOTE — Telephone Encounter (Signed)
Message copied by Chrystie Nose on Thu Feb 28, 2011 12:04 PM ------      Message from: Yancey Flemings      Created: Thu Feb 28, 2011 11:36 AM      Regarding: Chills       Bonita Quin, patient had ERCP with stent removal this week. He called Dr. Arlyce Dice last night c/o chills. Was placed on Cipro. Please call pt and see how he is feeling. If ok, have him come in today for LFTs and finish Cipro 500 bid x 7 days. Also, repeat LFTs in one week

## 2011-03-01 MED ORDER — CIPROFLOXACIN HCL 500 MG PO TABS: 500.0000 mg | ORAL_TABLET | Freq: Two times a day (BID) | ORAL | Status: AC

## 2011-03-01 NOTE — Telephone Encounter (Signed)
Addended by: Chrystie Nose on: 03/01/2011 08:33 AM   Modules accepted: Orders

## 2011-03-08 NOTE — Op Note (Signed)
NAMEMarland Hoffman  Jay, Hoffman NO.:  [id number]  MEDICAL RECORD NO.:  [id number]           PATIENT TYPE:  O  LOCATION:  [facility]                         FACILITY:  [facility]  PHYSICIAN:  Wilhemina Bonito. Marina Goodell, MD      DATE OF BIRTH:  Jan 26, 1946  DATE OF PROCEDURE:  02/25/2011 DATE OF DISCHARGE:                              OPERATIVE REPORT   PROCEDURE:  Endoscopic retrograde cholangiography with biliary stent removal.  INDICATION:  Evaluation of postoperative bile duct stricture.  HISTORY:  This is a 65 year old with HIV disease, hyperlipidemia, and BPH.  He was hospitalized in July 2011 with acute cholecystitis.  He subsequently underwent laparoscopic cholecystectomy, which was complicated by a bile duct laceration.  Subsequent ERCP demonstrated a biliary leak for which an 8.5 [place], 7-cm biliary endoprosthesis was placed.  Subsequent ERCP in September 2008 revealed healing of the previous leak.  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  For this problem, he has undergone placement of dual 10-French stents in September and again in December 2011. Improvement on most recent examination in December.  Recent liver tests minimally abnormal.  Clinically doing well.  He is now for ERCP with stent removal and reevaluation of the bile duct to assess for the need for further stenting.  PHYSICAL EXAMINATION:  GENERAL:  A well-appearing male in no acute distress. NEUROLOGIC:  He is alert and oriented. VITAL SIGNS:  Stable. LUNGS:  Clear. HEART:  Regular. ABDOMEN:  Benign.  PROCEDURE IN DETAIL:  After informed consent was obtained, the patient was sedated over the course of the procedure with 112.5 mcg of fentanyl and 12 mg of Versed IV.  Preoperative antibiotics in the form of Unasyn were provided.  Additionally, glucagon 0.5 mg was given as a duodenal relaxant.  The Pentax side-viewing scope was then passed blindly into the esophagus.  The stomach  and duodenal bulb were unremarkable.  The postbulbar duodenum revealed the presence of side-by-side biliary stents.  X-RAY FINDINGS: 1. Scout radiograph of the abdomen with the endoscope in position     revealed previously placed biliary stent as well as cholecystectomy     clips. 2. Injection of contrast through one existing stent outlined the     biliary tree. 3. The biliary stents were then subsequently removed. 4. Occlusion cholangiogram with a balloon demonstrated significant     improvement in the previously strictured area of the common bile     duct at the level of the clips.  The area was at least 8 mm in     diameter.  No other abnormalities noted.  Drainage excellent.  It     was elected to not replace the biliary stents given the current     anatomy. 5. No attempt at pancreatogram.  IMPRESSION: 1. History of intraoperative bile duct injury with leak status post     endoscopic retrograde cholangiopancreatography with stent     placement. 2. 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  Status post several sessions of dual     biliary  stenting times 7 months.  Now with significant     improvement/resolution of prior stricture.  RECOMMENDATIONS: 1. The patient should have liver function tests once monthly until     further notice. 2. Routine office followup in 3 to 4 months. 3. Contact the office in the interim for any questions or problems     such as the development of right upper quadrant pain, fevers, or     jaundice.     Wilhemina Bonito. Marina Goodell, MD     JNP/MEDQ  D:  02/25/2011  T:  02/25/2011  Job:  [id number]  cc:   Cherylynn Ridges, M.D. [address] [place] [address]  Acey Lav, MD Fax: [phone]  Electronically Signed by Yancey Flemings MD on 03/08/2011 10:03:21 AM

## 2011-03-09 LAB — T-HELPER CELL (CD4) - (RCID CLINIC ONLY): CD4 % Helper T Cell: 18 % — ABNORMAL LOW (ref 33–55)

## 2011-03-12 LAB — T-HELPER CELL (CD4) - (RCID CLINIC ONLY)
CD4 % Helper T Cell: 17 % — ABNORMAL LOW (ref 33–55)
CD4 T Cell Abs: 310 uL — ABNORMAL LOW (ref 400–2700)

## 2011-03-19 ENCOUNTER — Telehealth: Payer: Self-pay

## 2011-03-19 ENCOUNTER — Other Ambulatory Visit (INDEPENDENT_AMBULATORY_CARE_PROVIDER_SITE_OTHER): Payer: 59

## 2011-03-19 DIAGNOSIS — K831 Obstruction of bile duct: Secondary | ICD-10-CM

## 2011-03-19 LAB — HEPATIC FUNCTION PANEL
Bilirubin, Direct: 0.1 mg/dL (ref 0.0–0.3)
Total Bilirubin: 0.1 mg/dL — ABNORMAL LOW (ref 0.3–1.2)

## 2011-03-19 NOTE — Telephone Encounter (Signed)
Spoke with pt and reminded him that he was supposed to have come for lab work following the completion of his Cipro. Pt states he will come today for labs.

## 2011-03-20 ENCOUNTER — Telehealth: Payer: Self-pay

## 2011-03-20 NOTE — Telephone Encounter (Signed)
Message copied by Chrystie Nose on Wed Mar 20, 2011  9:35 AM ------      Message from: Yancey Flemings      Created: Tue Mar 19, 2011  5:54 PM       Please let patient know that his LFTs look better. I want him to have LFTs every 4 weeks

## 2011-03-20 NOTE — Telephone Encounter (Signed)
Pt notified of lab results per Dr. Marina Goodell. Pt to repeat LFT's in 4 weeks.

## 2011-03-20 NOTE — Telephone Encounter (Signed)
Message copied by Chrystie Nose on Wed Mar 20, 2011  9:30 AM ------      Message from: Yancey Flemings      Created: Tue Mar 19, 2011  5:54 PM       Please let patient know that his LFTs look better. I want him to have LFTs every 4 weeks

## 2011-03-20 NOTE — Telephone Encounter (Signed)
Message copied by Chrystie Nose on Wed Mar 20, 2011  9:37 AM ------      Message from: Yancey Flemings      Created: Tue Mar 19, 2011  5:54 PM       Please let patient know that his LFTs look better. I want him to have LFTs every 4 weeks

## 2011-04-09 ENCOUNTER — Other Ambulatory Visit (INDEPENDENT_AMBULATORY_CARE_PROVIDER_SITE_OTHER): Payer: 59

## 2011-04-09 DIAGNOSIS — Z79899 Other long term (current) drug therapy: Secondary | ICD-10-CM

## 2011-04-09 DIAGNOSIS — Z113 Encounter for screening for infections with a predominantly sexual mode of transmission: Secondary | ICD-10-CM

## 2011-04-09 DIAGNOSIS — B20 Human immunodeficiency virus [HIV] disease: Secondary | ICD-10-CM

## 2011-04-09 LAB — CBC WITH DIFFERENTIAL/PLATELET
Basophils Absolute: 0 10*3/uL (ref 0.0–0.1)
HCT: 39.7 % (ref 39.0–52.0)
Hemoglobin: 13.3 g/dL (ref 13.0–17.0)
Lymphocytes Relative: 43 % (ref 12–46)
Monocytes Absolute: 0.3 10*3/uL (ref 0.1–1.0)
Monocytes Relative: 6 % (ref 3–12)
Neutro Abs: 2.6 10*3/uL (ref 1.7–7.7)
Neutrophils Relative %: 50 % (ref 43–77)
WBC: 5.2 10*3/uL (ref 4.0–10.5)

## 2011-04-09 LAB — LIPID PANEL
Cholesterol: 173 mg/dL (ref 0–200)
VLDL: 21 mg/dL (ref 0–40)

## 2011-04-10 LAB — COMPLETE METABOLIC PANEL WITH GFR
AST: 27 U/L (ref 0–37)
Albumin: 4.2 g/dL (ref 3.5–5.2)
BUN: 18 mg/dL (ref 6–23)
Calcium: 9.4 mg/dL (ref 8.4–10.5)
Chloride: 106 mEq/L (ref 96–112)
Glucose, Bld: 98 mg/dL (ref 70–99)
Potassium: 4.8 mEq/L (ref 3.5–5.3)
Sodium: 137 mEq/L (ref 135–145)
Total Protein: 8.3 g/dL (ref 6.0–8.3)

## 2011-04-10 LAB — HIV-1 RNA QUANT-NO REFLEX-BLD: HIV-1 RNA Quant, Log: <1.3 {Log} (ref <1.30)

## 2011-04-16 ENCOUNTER — Other Ambulatory Visit (INDEPENDENT_AMBULATORY_CARE_PROVIDER_SITE_OTHER): Payer: 59

## 2011-04-16 ENCOUNTER — Telehealth: Payer: Self-pay

## 2011-04-16 ENCOUNTER — Other Ambulatory Visit: Payer: Self-pay | Admitting: Internal Medicine

## 2011-04-16 DIAGNOSIS — R7989 Other specified abnormal findings of blood chemistry: Secondary | ICD-10-CM

## 2011-04-16 LAB — HEPATIC FUNCTION PANEL
ALT: 24 U/L (ref 0–53)
AST: 26 U/L (ref 0–37)
Albumin: 3.6 g/dL (ref 3.5–5.2)
Total Protein: 7.6 g/dL (ref 6.0–8.3)

## 2011-04-16 NOTE — Telephone Encounter (Signed)
Pt aware of results per Dr. Marina Goodell. Pt knows to repeat labs in 2 months.

## 2011-04-16 NOTE — Telephone Encounter (Signed)
Message copied by Chrystie Nose on Tue Apr 16, 2011  2:42 PM ------      Message from: Yancey Flemings      Created: Tue Apr 16, 2011 10:26 AM       Let pt know lfts look good. Repeat lft's in 2 months

## 2011-04-17 ENCOUNTER — Ambulatory Visit: Payer: 59 | Admitting: Infectious Disease

## 2011-04-19 NOTE — H&P (Signed)
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 He is generally in excellent health. He states that around  November he began to have persistent rectal pain. This caused him to have  decreased appetite. Eventually he was evaluated by Dr. 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 This was treated with  incision and drainage and fistulotomy on January 13th. His wife estimates  that in October of this year his weight was 215 pounds. His weight has  gradually decreased to 274 pounds. He states that this was because he had  decreased appetite when he had the perirectal abscess and that after the  perirectal abscess was treated he did not want to eat very much for fear  that he would have bowel movements secondary to pain. About six weeks ago he  began to complain of a persistent cough. He was seen by Dr. Arvilla Market in the  office, perhaps about two weeks ago, and at that time his chest x-ray was  normal. He was treated empirically for bronchitis with a Z-Pak and told to  return. 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 His O2  saturation was noted to be 91%. Dr. Arvilla Market also noted that he had evidence  of oral thrush. In light of these three findings admission was recommended.  The patient's wife thinks that he has probably had a fever since Thursday.  He has not complained of a headache. He has noted an increasingly severe  sore throat over the last three days. His wife thinks that he  is urinating  more frequently. He has not had any problems with nausea, vomiting,  abdominal pain, melena, or  hematochezia. He is admitted at this time for  further evaluation and treatment.   PAST MEDICAL HISTORY:   MEDICATIONS:  None.   ALLERGIES:  None.   OPERATION:  He has had no operations, but does recall that he has passed two  kidney stones. He has also had a tonsillectomy in the past. He cannot recall  any other illnesses.   FAMILY HISTORY:  His father died of throat cancer, which is a source of  great concern to him. His mother is in good health. He has seven sisters who  are in good health.   SOCIAL HISTORY:  He does not smoke. He only uses alcohol occasionally. He  states that he has been married to his current wife for 35 years and they  have several children. He denies any history of intravenous drug use.   REVIEW  OF SYSTEMS:  HEAD: He denies headache or dizziness. EYES: He denies  visual blurring or diplopia. EARS/NOSE/THROAT: Denies earache or sinus pain.  Otherwise, see above. CHEST: See above. CARDIOVASCULAR: Denies orthopnea,  PND, or ankle edema. GI: Denies nausea, vomiting, abdominal pain, change in  bowel habits, melena, or hematochezia. GU: He has noticed that his urine  seems dark. There has been no dysuria. NEUROLOGIC: No history of seizure or  stroke. ENDO: As mentioned previously there has been increased urinary  frequency.   PHYSICAL EXAMINATION:  VITAL SIGNS: Temperature 98, blood pressure 108/73,  pulse 110, respirations 22, O2 saturation 91%.  HEENT:  Tympanic membranes are clear. Nares patent. Examination of the  pharynx reveals the patient has very striking diffuse oral thrush involving  his tongue and soft palate.  CHEST: A few fine rales at the right base.  CARDIOVASCULAR: Normal S1 and S2 without murmurs, rubs, or gallops.  ABDOMEN: Benign. Minimal bowel sounds without masses, tenderness, or  organomegaly.  NEUROLOGIC: Cranial nerves  within normal limits.  EXTREMITIES: No cyanosis or edema.   IMPRESSION:  1. Bibasilar interstitial pneumonia.  2. Oral thrush.  3. 40-pound weight loss over seven months.  4. Recent history of fistula in ano.  5. History of kidney stones.     PLAN:  This gentleman presents with a diffuse interstitial pneumonitis as  well as evidence of immunodeficiency as indicated by the presence of oral  thrush. It is conceivable that he could have diabetes which would account  for the oral thrush and the weight loss. He does not seem to have any risk  factors for HIV infection and I did try as much as I could to probe for this  possibility. Will obtain a CBC and CMET looking particularly at his blood  sugars and live functions. I am also going to obtain an LDH which is an  indirect way of screening for pneumocystis infection. For now will start him  on Avelox by the intravenous route, oral Mycelex Troches, and Diflucan.  Subsequent workup will depend on the results of these tests.     SY/MEDQ  D:  04/30/2005  T:  04/30/2005  Job:  [id number]   cc:   Donia Guiles, M.D.  [address] [place]  [place] [address]  Fax: [phone]

## 2011-04-19 NOTE — Op Note (Signed)
NAME:  Jay Hoffman, Jay Hoffman NO.:  [id number]   MEDICAL RECORD NO.:  [id number]          PATIENT TYPE:  AMB   LOCATION:  DAY                          FACILITY:  [facility]   PHYSICIAN:  Timothy E. Earlene Plater, M.D. DATE OF BIRTH:  08/18/1946   DATE OF PROCEDURE:  12/14/2004  DATE OF DISCHARGE:                                 OPERATIVE REPORT   PREOPERATIVE DIAGNOSIS:  Abscess.   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   PROCEDURES:  1.  Examination under anesthesia.  2.  Incision and drainage of abscess.  3.  Complete fistulotomy.   SURGEON:  Timothy E. Earlene Plater, M.D.   ANESTHESIA:  General.   Mr. Nastasi had been seen and treated in the office for perianal sepsis.  Treated with antibiotics and local measures.  Sepsis is now controlled and  he is ready to proceed with exploratory surgery for definitive treatment.  He agrees and understands.  He was seen, identified and the permit signed.   DESCRIPTION OF PROCEDURE:  The patient was taken to the operating room and  placed supine.  LMA anesthesia provided.  He was placed in lithotomy.  Perianal area inspected.  Prepped and draped in the usual fashion.  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  A probe passed easily and without  resistance into the distal posterior cryptic area.  There was no fissure.  The overlying bridge of skin was incised.  The cavity of the abscess was  debrided and the sides and walls of the fistula were then cauterized.  There  were no complications.  Gelfoam gauze and dry sterile dressing applied.  He  tolerated it.  Counts correct.  He was awaken and taken to the recovery room  in good condition.   Written and verbal instructions given, including the use of the antibiotics  and pain medications he has.  He will seen and followed in the office.      TED/MEDQ  D:  12/14/2004  T:  12/14/2004  Job:  [id number]

## 2011-04-19 NOTE — Discharge Summary (Signed)
NAMEMarland Hoffman  YANDEL, ZEINER NO.:  [id number]   MEDICAL RECORD NO.:  [id number]          PATIENT TYPE:  INP   LOCATION:  6711                         FACILITY:  [facility]   PHYSICIAN:  Deirdre Peer. Polite, M.D. DATE OF BIRTH:  23-Jan-1946   DATE OF ADMISSION:  04/30/2005  DATE OF DISCHARGE:                                 DISCHARGE SUMMARY   DISCHARGE DIAGNOSES:  1.  Newly diagnosed 042.  2.  Bilateral pneumonia consistent with PCP.  3.  Positive blood culture for Cryptococcus.  4.  Recent history of unexplained weight loss, most likely secondary to #1.  5.  Oral thrush, resolved.   DISCHARGE MEDICATIONS:  1.  Bactrim DS two q.8h x21 days, then three times a week.  2.  Diflucan 400 mg daily x10 weeks.  3.  The patient will be on steroid taper; 40 mg daily x5 days, then 20 mg      daily x11 days.   DISPOSITION:  The patient is being discharged home in stable condition.  Asked to follow up with primary MD in approximately one to two weeks.  Also,  the patient has appointment with Dr. Burnice Logan on June 26 at 3:45 in the  ID clinic.   STUDIES:  The patient had chest x-ray which showed bilateral asymmetric  __________infiltrate.  Blood culture positive 2/2 for Cryptococcus  neoformans.  BMET within normal limits.  Hepatitis study surface antigen  negative.  Surface antibody positive.  Hepatitis C antibody negative.  Viral  load greater than 100,000.  HIV reactive.  UA within normal limits.  CD-4  count absolute 110.   HISTORY OF PRESENT ILLNESS:  A 65 year old male sent to the hospital for  evaluation and treatment of pneumonia.  On outpatient basis, the patient was  found to have bilateral infiltrates consistent with pneumonia and decreased  O2 saturations.  Of note, the patient  has had history of weight loss which  has been unexplained and recently treated with  Z-Pak.  Did not improve.  On  followup, the patient had bibasilar interstitial infiltrates.  Therefore,  admission was deemed  necessary for further evaluation and treatment.  Please see dictated H&P for further details.   PAST MEDICAL HISTORY:  As stated above.   MEDICATIONS ON ADMISSION:  None.   ALLERGIES:  None.   OPERATION:  As stated in admission H&P.   HOSPITAL COURSE:  The patient was admitted to medicine floor bed for  evaluation and treatment of pneumonia.  Of note, on physical exam the  patient was found to have oral thrush.  Therefore, entertained a high  suspicion for possible immunocompromised state.  The patient has not been  known to be diabetic as the patient has also had unexplained weight loss.  Testing for O42 was advised.  The patient was initially resistant to this  testing.  Ultimately did agree and test did show positive.  The patient's CD-  4 count is 110.  The patient's antibiotics were adjusted to cover atypical  organisms; i.e. TCP.  The patient was started on Bactrim.  Currently, he is  on oral Bactrim.  He is tolerating it well.  In addition, the patient is on  oral prednisone.  The patient's hospitalization was complicated by positive  blood cultures for Cryptococcus which necessitated the need of oral  Diflucan.  For completeness, the patient was seen in hospital by infectious  disease.  Concurs with current antibiotic regimen.  The patient will have  further outpatient followup with infectious disease as well as outpatient  followup with primary MD.  The patient's medication regimen will include  Bactrim DS two tabs q.8h for 21 days, Diflucan 400 mg daily x10 weeks, and a  tapering course of steroids.  Recommendations for antiretroviral therapy  will be made once outpatient followup with infectious disease doctor.  The  patient has been advised to reveal to his wife the results of the test, and  he states that he has, and states that there are plans for her to be tested  on outpatient basis.  The patient has also been advised on __________.  At  this  time as stated, the patient is stable for discharge.       RDP/MEDQ  D:  05/09/2005  T:  05/09/2005  Job:  [id number]   cc:   Donia Guiles, M.D.  [address] [place]  [place] [address]  Fax: [phone]   Rockey Situ. Flavia Shipper., M.D.  [address]  [place]  [place] [address]  Fax: [phone]

## 2011-04-23 ENCOUNTER — Ambulatory Visit: Payer: 59 | Admitting: Infectious Disease

## 2011-05-13 ENCOUNTER — Encounter: Payer: Self-pay | Admitting: Infectious Disease

## 2011-05-13 ENCOUNTER — Encounter: Payer: Self-pay | Admitting: Internal Medicine

## 2011-05-13 ENCOUNTER — Ambulatory Visit (INDEPENDENT_AMBULATORY_CARE_PROVIDER_SITE_OTHER): Payer: 59 | Admitting: Infectious Disease

## 2011-05-13 ENCOUNTER — Ambulatory Visit (INDEPENDENT_AMBULATORY_CARE_PROVIDER_SITE_OTHER): Payer: 59 | Admitting: Internal Medicine

## 2011-05-13 VITALS — BP 116/75 | HR 76 | Temp 98.2°F | Ht 68.5 in | Wt 205.0 lb

## 2011-05-13 VITALS — BP 116/68 | HR 76 | Ht 68.5 in | Wt 204.0 lb

## 2011-05-13 DIAGNOSIS — S36129A Unspecified injury of gallbladder, initial encounter: Secondary | ICD-10-CM

## 2011-05-13 DIAGNOSIS — K831 Obstruction of bile duct: Secondary | ICD-10-CM

## 2011-05-13 DIAGNOSIS — Z21 Asymptomatic human immunodeficiency virus [HIV] infection status: Secondary | ICD-10-CM

## 2011-05-13 DIAGNOSIS — E785 Hyperlipidemia, unspecified: Secondary | ICD-10-CM

## 2011-05-13 DIAGNOSIS — K832 Perforation of bile duct: Secondary | ICD-10-CM

## 2011-05-13 DIAGNOSIS — N529 Male erectile dysfunction, unspecified: Secondary | ICD-10-CM

## 2011-05-13 DIAGNOSIS — Z87898 Personal history of other specified conditions: Secondary | ICD-10-CM

## 2011-05-13 DIAGNOSIS — B20 Human immunodeficiency virus [HIV] disease: Secondary | ICD-10-CM

## 2011-05-13 DIAGNOSIS — S3613XA Injury of bile duct, initial encounter: Secondary | ICD-10-CM

## 2011-05-13 MED ORDER — EFAVIRENZ-EMTRICITAB-TENOFOVIR 600-200-300 MG PO TABS: 1.0000 | ORAL_TABLET | Freq: Every day | ORAL | Status: DC

## 2011-05-13 MED ORDER — SILDENAFIL CITRATE 50 MG PO TABS: ORAL_TABLET | ORAL | Status: DC

## 2011-05-13 NOTE — Progress Notes (Signed)
HISTORY OF PRESENT ILLNESS:  Jay Hoffman is a 65 y.o. male with HIV disease, hyperlipidemia, and BPH. He has been followed in this office for postoperative bile duct leak and benign biliary stricture, both of which have required endoscopic therapy. He completed in 8 months session of stenting for a benign biliary stricture on 02/27/2011. Transient mild cholangitis post procedure, but otherwise been doing well. No problems with abdominal pain, fever, or jaundice. Good appetite. Other conditions are doing well. Blood work from 4 weeks ago essentially normal except for mild elevation of alkaline phosphatase at 137.  REVIEW OF SYSTEMS:  All non-GI ROS negative.  Past Medical History  Diagnosis Date  . Acute cholecystitis   . Unspecified personal history presenting hazards to health   . Other and unspecified hyperlipidemia   . Problems related to high-risk sexual behavior   . BPH (benign prostatic hyperplasia)   . Cryptococcosis   . Pneumocystosis   . Human immunodeficiency virus (HIV) disease     Past Surgical History  Procedure Date  . Cholecystectomy   . Ercp     Social History NIKOLAJ GERAGHTY  reports that he has never smoked. He has never used smokeless tobacco. He reports that he drinks alcohol. He reports that he does not use illicit drugs.  family history includes Diabetes in his paternal aunt; Esophageal cancer in his father; and Kidney disease in his sister.  There is no history of Colon cancer.  No Known Allergies     PHYSICAL EXAMINATION: Vital signs: BP 116/68  Pulse 76  Ht 5' 8.5" (1.74 m)  Wt 204 lb (92.534 kg)  BMI 30.57 kg/m2 General: Well-developed, well-nourished, no acute distress HEENT: Sclerae are anicteric, conjunctiva pink. Oral mucosa intact Lungs: Clear Heart: Regular Abdomen: soft, nontender, nondistended, no obvious ascites, no peritoneal signs, normal bowel sounds. No organomegaly. Extremities: No edema Psychiatric: alert and oriented x3.  Cooperative    ASSESSMENT:  #1. Postoperative bile duct leak status post successful endoscopic therapy #2. 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 Status post 8 months session of endoscopic stenting (principally dual 10 French stents) any 02/27/2011. Has done well since. LFTs were weeks ago essentially normal #3. Colon cancer screening. Needs to be assessed   PLAN:  #1. Repeat liver tests in one month as previously planned. Periodic liver tests thereafter per my direction #2. Routine office followup in 6 months. The patient is to contact the office in the interim if she has any problems such as abdominal pain, jaundice, or fevers. #3. Assess patient's colon cancer screening status. If he has not had prior screening colonoscopy, he should be set up for one. We can address this at his next visit.

## 2011-05-13 NOTE — Assessment & Plan Note (Signed)
Continue his Pravachol

## 2011-05-13 NOTE — Assessment & Plan Note (Signed)
Continue his Proscar

## 2011-05-13 NOTE — Assessment & Plan Note (Signed)
Stent in place presenting repeat liver function test done today by Dr. Marina Goodell.

## 2011-05-13 NOTE — Patient Instructions (Signed)
Return in 2 months July 15th to have your LFTs drawn.  Go to basement level to lab. Follow-up in 6 months.

## 2011-05-13 NOTE — Assessment & Plan Note (Signed)
Testosterone was normal. I will prescribe him some Viagra

## 2011-05-13 NOTE — Assessment & Plan Note (Signed)
Superb control. 

## 2011-05-13 NOTE — Assessment & Plan Note (Signed)
Status post cholecystectomy. Now also with stent in place.

## 2011-05-13 NOTE — Progress Notes (Signed)
  Subjective:    Patient ID: Jay Hoffman, male    DOB: February 08, 1946, 65 y.o.   MRN: [id number]  HPI 65 yo with HIV well controlled, whom I admitted in July with abdominal pain intermitent and fever, found to ahve perforated gallbladder sp lap resection and placmetn of stent by GI He was brought back in December for ERCP which showed . Re-examination today showed improvedappearance of the stricture with some residual wasting. An additional Stent was placed in hopes of improving th chance that he will not develop high-grade stricturing with obstruction. He's been followed closely bowel bowel gastroenterology and his liver function tests have been stable. His most recent alkaline phosphatase was slightly elevated. He is going to see all of our GI today for repeat lab testing. He has no other specific complaints today and is in good spirits. He does continue to have problems with erectile dysfunction. He is not to interact with other people but is having difficulty maintaining erections in achieving erections with masturbation. We reviewed his testosterone levels and his thyroid function tests are prescribed some Viagra. We  We spent greater than 45 minutes with the patient including counseling the patient was graded at the present time in consultation and coordination of his care.  Review of Systems As in history present although was 12 point systems is negative.    Objective:   Physical Exam    patient is alert and oriented x4. A duodenum is documented as a grommet for lites back care cortex clear neck supple breast exam regular rhythm no murmurs or rubs heard lungs clear to auscultation bilaterally. Abdomen soft nondistended nontender. Cervical scars are well-healed. Extremities without edema neurologic exam nonfocal psychiatric normal thought    Assessment & Plan:  HIV INFECTION Superb control  PERFORATION OF BILE DUCT Status post cholecystectomy. Now also with stent in place.  BILE DUCT  STRICTURE Stent in place presenting repeat liver function test done today by Dr. Marina Goodell.  Erectile dysfunction Testosterone was normal. I will prescribe him some Viagra  BENIGN PROSTATIC HYPERTROPHY, MILD, HX OF Continue his Proscar  HYPERLIPIDEMIA, WITH LOW HDL Continue his Pravachol

## 2011-05-15 ENCOUNTER — Ambulatory Visit: Payer: 59 | Admitting: Internal Medicine

## 2011-05-15 ENCOUNTER — Ambulatory Visit: Payer: 59 | Admitting: Infectious Disease

## 2011-06-18 ENCOUNTER — Other Ambulatory Visit (INDEPENDENT_AMBULATORY_CARE_PROVIDER_SITE_OTHER): Payer: 59

## 2011-06-18 ENCOUNTER — Telehealth: Payer: Self-pay

## 2011-06-18 DIAGNOSIS — K831 Obstruction of bile duct: Secondary | ICD-10-CM

## 2011-06-18 LAB — HEPATIC FUNCTION PANEL
Alkaline Phosphatase: 145 U/L — ABNORMAL HIGH (ref 39–117)
Bilirubin, Direct: 0.1 mg/dL (ref 0.0–0.3)
Total Bilirubin: 0.4 mg/dL (ref 0.3–1.2)
Total Protein: 8.2 g/dL (ref 6.0–8.3)

## 2011-06-18 NOTE — Telephone Encounter (Signed)
Pt aware.

## 2011-06-18 NOTE — Telephone Encounter (Signed)
Message copied by Michele Mcalpine on Tue Jun 18, 2011  2:46 PM ------      Message from: Hilarie Fredrickson      Created: Tue Jun 18, 2011  1:27 PM       Let pt know that his LFTs look fine. Please repeat LFTs in 3 months

## 2011-06-28 ENCOUNTER — Other Ambulatory Visit: Payer: Self-pay | Admitting: *Deleted

## 2011-06-28 DIAGNOSIS — E785 Hyperlipidemia, unspecified: Secondary | ICD-10-CM

## 2011-06-28 DIAGNOSIS — B20 Human immunodeficiency virus [HIV] disease: Secondary | ICD-10-CM

## 2011-06-28 MED ORDER — PRAVASTATIN SODIUM 40 MG PO TABS: 40.0000 mg | ORAL_TABLET | Freq: Every day | ORAL | Status: DC

## 2011-08-27 LAB — T-HELPER CELL (CD4) - (RCID CLINIC ONLY): CD4 % Helper T Cell: 13 — ABNORMAL LOW

## 2011-08-30 LAB — T-HELPER CELL (CD4) - (RCID CLINIC ONLY)
CD4 % Helper T Cell: 16 — ABNORMAL LOW
CD4 T Cell Abs: 300 — ABNORMAL LOW

## 2011-09-06 LAB — T-HELPER CELL (CD4) - (RCID CLINIC ONLY)
CD4 % Helper T Cell: 15 % — ABNORMAL LOW (ref 33–55)
CD4 T Cell Abs: 260 uL — ABNORMAL LOW (ref 400–2700)

## 2011-09-10 ENCOUNTER — Other Ambulatory Visit (INDEPENDENT_AMBULATORY_CARE_PROVIDER_SITE_OTHER): Payer: 59

## 2011-09-10 ENCOUNTER — Telehealth: Payer: Self-pay

## 2011-09-10 DIAGNOSIS — K831 Obstruction of bile duct: Secondary | ICD-10-CM

## 2011-09-10 DIAGNOSIS — K819 Cholecystitis, unspecified: Secondary | ICD-10-CM

## 2011-09-10 LAB — HEPATIC FUNCTION PANEL
Albumin: 3.8 g/dL (ref 3.5–5.2)
Total Bilirubin: 0.4 mg/dL (ref 0.3–1.2)

## 2011-09-10 LAB — T-HELPER CELL (CD4) - (RCID CLINIC ONLY)
CD4 % Helper T Cell: 15 — ABNORMAL LOW
CD4 T Cell Abs: 270 — ABNORMAL LOW

## 2011-09-10 NOTE — Telephone Encounter (Signed)
Message copied by Michele Mcalpine on Tue Sep 10, 2011  1:19 PM ------      Message from: Hilarie Fredrickson      Created: Tue Sep 10, 2011 12:24 PM       Let pt know that labs look good. Repeat lfts in 3 months

## 2011-09-10 NOTE — Telephone Encounter (Signed)
Pt aware.

## 2011-10-07 ENCOUNTER — Other Ambulatory Visit (INDEPENDENT_AMBULATORY_CARE_PROVIDER_SITE_OTHER): Payer: 59

## 2011-10-07 ENCOUNTER — Other Ambulatory Visit: Payer: Self-pay | Admitting: Infectious Disease

## 2011-10-07 DIAGNOSIS — Z79899 Other long term (current) drug therapy: Secondary | ICD-10-CM

## 2011-10-07 DIAGNOSIS — B20 Human immunodeficiency virus [HIV] disease: Secondary | ICD-10-CM

## 2011-10-07 DIAGNOSIS — Z113 Encounter for screening for infections with a predominantly sexual mode of transmission: Secondary | ICD-10-CM

## 2011-10-07 LAB — RPR

## 2011-10-07 LAB — CBC WITH DIFFERENTIAL/PLATELET
Basophils Absolute: 0 10*3/uL (ref 0.0–0.1)
Basophils Relative: 0 % (ref 0–1)
HCT: 41.8 % (ref 39.0–52.0)
Lymphocytes Relative: 22 % (ref 12–46)
MCHC: 33.7 g/dL (ref 30.0–36.0)
Monocytes Absolute: 0.4 10*3/uL (ref 0.1–1.0)
Neutro Abs: 6.4 10*3/uL (ref 1.7–7.7)
Neutrophils Relative %: 73 % (ref 43–77)
Platelets: 233 10*3/uL (ref 150–400)
RDW: 14.2 % (ref 11.5–15.5)
WBC: 8.8 10*3/uL (ref 4.0–10.5)

## 2011-10-07 LAB — COMPLETE METABOLIC PANEL WITH GFR
Albumin: 4.5 g/dL (ref 3.5–5.2)
Alkaline Phosphatase: 137 U/L — ABNORMAL HIGH (ref 39–117)
BUN: 20 mg/dL (ref 6–23)
CO2: 23 mEq/L (ref 19–32)
Calcium: 9.7 mg/dL (ref 8.4–10.5)
GFR, Est African American: 64 mL/min — ABNORMAL LOW (ref >89)
GFR, Est Non African American: 55 mL/min — ABNORMAL LOW (ref >89)
Glucose, Bld: 98 mg/dL (ref 70–99)
Potassium: 4.4 mEq/L (ref 3.5–5.3)
Sodium: 139 mEq/L (ref 135–145)
Total Protein: 8.3 g/dL (ref 6.0–8.3)

## 2011-10-07 LAB — LIPID PANEL
HDL: 41 mg/dL (ref >39)
Total CHOL/HDL Ratio: 4.7 Ratio
Triglycerides: 191 mg/dL — ABNORMAL HIGH (ref <150)

## 2011-10-08 LAB — T-HELPER CELL (CD4) - (RCID CLINIC ONLY): CD4 % Helper T Cell: 17 % — ABNORMAL LOW (ref 33–55)

## 2011-10-09 LAB — HIV-1 RNA QUANT-NO REFLEX-BLD: HIV-1 RNA Quant, Log: <1.3 {Log} (ref <1.30)

## 2011-10-21 ENCOUNTER — Encounter: Payer: Self-pay | Admitting: Infectious Disease

## 2011-10-21 ENCOUNTER — Ambulatory Visit (INDEPENDENT_AMBULATORY_CARE_PROVIDER_SITE_OTHER): Payer: 59 | Admitting: Infectious Disease

## 2011-10-21 VITALS — BP 114/76 | HR 71 | Temp 98.0°F | Wt 211.0 lb

## 2011-10-21 DIAGNOSIS — Z87898 Personal history of other specified conditions: Secondary | ICD-10-CM

## 2011-10-21 DIAGNOSIS — K831 Obstruction of bile duct: Secondary | ICD-10-CM

## 2011-10-21 DIAGNOSIS — B20 Human immunodeficiency virus [HIV] disease: Secondary | ICD-10-CM

## 2011-10-21 DIAGNOSIS — Z113 Encounter for screening for infections with a predominantly sexual mode of transmission: Secondary | ICD-10-CM

## 2011-10-21 DIAGNOSIS — E785 Hyperlipidemia, unspecified: Secondary | ICD-10-CM

## 2011-10-21 NOTE — Assessment & Plan Note (Signed)
Continue atripla, meet with [name] re his medicare rx plan options

## 2011-10-21 NOTE — Progress Notes (Signed)
  Subjective:    Patient ID: Jay Hoffman, male    DOB: [birth year], 65 y.o.   MRN: [id number]  HPI  Jay Hoffman is a 65 y.o. male who is doing superbly well on their antiviral regimen, atripla with undetectable viral load and health cd4 count. He is concerned about his upcoming enrollment in medicare and concerned about copays for his atripla. I am having him meet with [name] counselor. Stents are out from biliary system and lfts stable. NO other complaints   Review of Systems  Constitutional: Negative for fever, chills, diaphoresis, activity change, appetite change, fatigue and unexpected weight change.  HENT: Negative for congestion, sore throat, rhinorrhea, sneezing, trouble swallowing and sinus pressure.   Eyes: Negative for photophobia and visual disturbance.  Respiratory: Negative for cough, chest tightness, shortness of breath, wheezing and stridor.   Cardiovascular: Negative for chest pain, palpitations and leg swelling.  Gastrointestinal: Negative for nausea, vomiting, abdominal pain, diarrhea, constipation, blood in stool, abdominal distention and anal bleeding.  Genitourinary: Negative for dysuria, hematuria, flank pain and difficulty urinating.  Musculoskeletal: Negative for myalgias, back pain, joint swelling, arthralgias and gait problem.  Skin: Negative for color change, pallor, rash and wound.  Neurological: Negative for dizziness, tremors, weakness and light-headedness.  Hematological: Negative for adenopathy. Does not bruise/bleed easily.  Psychiatric/Behavioral: Negative for behavioral problems, confusion, sleep disturbance, dysphoric mood, decreased concentration and agitation.       Objective:   Physical Exam  Constitutional: He is oriented to person, place, and time. He appears well-developed and well-nourished. No distress.  HENT:  Head: Normocephalic and atraumatic.  Mouth/Throat: Oropharyngeal exudate present.  Eyes: Conjunctivae and EOM are normal. Pupils  are equal, round, and reactive to light. No scleral icterus.  Neck: Normal range of motion. Neck supple. No JVD present.  Cardiovascular: Normal rate, regular rhythm and normal heart sounds.  Exam reveals no gallop and no friction rub.   No murmur heard. Pulmonary/Chest: Effort normal and breath sounds normal. No respiratory distress. He has no wheezes. He has no rales. He exhibits no tenderness.  Abdominal: He exhibits no distension and no mass. There is no tenderness. There is no rebound and no guarding.  Musculoskeletal: He exhibits no edema and no tenderness.  Lymphadenopathy:    He has no cervical adenopathy.  Neurological: He is alert and oriented to person, place, and time. He has normal reflexes. He exhibits normal muscle tone. Coordination normal.  Skin: Skin is warm and dry. He is not diaphoretic. No erythema. No pallor.  Psychiatric: He has a normal mood and affect. His behavior is normal. Judgment and thought content normal.          Assessment & Plan:  HIV INFECTION Continue atripla, meet with [name] re his medicare rx plan options  BILE DUCT STRICTURE Stents out, being followed by Dr. Marina Goodell  BENIGN PROSTATIC HYPERTROPHY, MILD, HX OF On proscar, stable

## 2011-10-21 NOTE — Assessment & Plan Note (Signed)
Stents out, being followed by Dr. Marina Goodell

## 2011-10-21 NOTE — Assessment & Plan Note (Signed)
On proscar, stable

## 2011-12-10 ENCOUNTER — Other Ambulatory Visit (INDEPENDENT_AMBULATORY_CARE_PROVIDER_SITE_OTHER): Payer: Medicare Other

## 2011-12-10 DIAGNOSIS — K819 Cholecystitis, unspecified: Secondary | ICD-10-CM

## 2011-12-10 LAB — HEPATIC FUNCTION PANEL
AST: 30 U/L (ref 0–37)
Albumin: 4 g/dL (ref 3.5–5.2)
Total Bilirubin: 0.4 mg/dL (ref 0.3–1.2)

## 2011-12-11 ENCOUNTER — Telehealth: Payer: Self-pay | Admitting: *Deleted

## 2011-12-11 DIAGNOSIS — K831 Obstruction of bile duct: Secondary | ICD-10-CM

## 2011-12-11 NOTE — Telephone Encounter (Signed)
Spoke with patient and gave him results as per Dr. Marina Goodell. Labs in [facility] for 06/08/12. Note to remind patient.

## 2011-12-11 NOTE — Telephone Encounter (Signed)
Message copied by Daphine Deutscher on Wed Dec 11, 2011  3:50 PM ------      Message from: Hilarie Fredrickson      Created: Wed Dec 11, 2011 11:56 AM       Please let [name] know that his liver tests are entirely normal. Please repeat liver tests in 6 months. Thank you

## 2012-01-13 ENCOUNTER — Telehealth: Payer: Self-pay | Admitting: *Deleted

## 2012-01-13 NOTE — Telephone Encounter (Signed)
Jay Hoffman called stating he is having trouble obtaining his Atripla, his copay is $600.00 per month. He spoke with [name] to try and get [name], but he does not qualify because he is not in "the donut hole yet", he has to have met $6,000.00 first.  He has done research on the internet and found a pharmacy in [place], [name], where he can get it for $231.00 per month. Told him I will check with Dr. Daiva Eves and return his call.  He would need a printed Rx. Wendall Mola CMA

## 2012-01-16 ENCOUNTER — Other Ambulatory Visit: Payer: Self-pay | Admitting: Licensed Clinical Social Worker

## 2012-01-16 DIAGNOSIS — B20 Human immunodeficiency virus [HIV] disease: Secondary | ICD-10-CM

## 2012-01-16 MED ORDER — EFAVIRENZ-EMTRICITAB-TENOFOVIR 600-200-300 MG PO TABS: 1.0000 | ORAL_TABLET | Freq: Every day | ORAL | Status: DC

## 2012-01-20 NOTE — Telephone Encounter (Signed)
Jay Hoffman, spoke with Dr. Ninetta Lights and he okd a written script for the Atripla.  He advised that it be explained to patient that because [place] drug from [place] it can not be certain that he is getting the correct medication.  Patient said he understood, and Rx was left upfront for him to pick up. Wendall Mola CMA

## 2012-02-25 ENCOUNTER — Other Ambulatory Visit: Payer: Self-pay | Admitting: Infectious Disease

## 2012-02-25 DIAGNOSIS — Z113 Encounter for screening for infections with a predominantly sexual mode of transmission: Secondary | ICD-10-CM

## 2012-02-26 ENCOUNTER — Other Ambulatory Visit: Payer: Self-pay | Admitting: *Deleted

## 2012-02-26 DIAGNOSIS — Z113 Encounter for screening for infections with a predominantly sexual mode of transmission: Secondary | ICD-10-CM

## 2012-04-06 ENCOUNTER — Other Ambulatory Visit: Payer: 59

## 2012-04-07 ENCOUNTER — Other Ambulatory Visit: Payer: Medicare Other

## 2012-04-07 ENCOUNTER — Other Ambulatory Visit (HOSPITAL_COMMUNITY)
Admission: RE | Admit: 2012-04-07 | Discharge: 2012-04-07 | Disposition: A | Discharge status: Home / Self Care | Payer: Medicare Other | Source: Ambulatory Visit | Attending: Infectious Disease | Admitting: Infectious Disease

## 2012-04-07 DIAGNOSIS — Z113 Encounter for screening for infections with a predominantly sexual mode of transmission: Secondary | ICD-10-CM | POA: Insufficient documentation

## 2012-04-07 DIAGNOSIS — B20 Human immunodeficiency virus [HIV] disease: Secondary | ICD-10-CM

## 2012-04-07 LAB — COMPLETE METABOLIC PANEL WITH GFR
ALT: 20 U/L (ref 0–53)
Albumin: 4.3 g/dL (ref 3.5–5.2)
Alkaline Phosphatase: 131 U/L — ABNORMAL HIGH (ref 39–117)
Potassium: 4.4 mEq/L (ref 3.5–5.3)
Sodium: 138 mEq/L (ref 135–145)
Total Bilirubin: 0.3 mg/dL (ref 0.3–1.2)
Total Protein: 7.9 g/dL (ref 6.0–8.3)

## 2012-04-07 LAB — CBC WITH DIFFERENTIAL/PLATELET
Basophils Relative: 0 % (ref 0–1)
Eosinophils Absolute: 0.1 10*3/uL (ref 0.0–0.7)
Hemoglobin: 14.2 g/dL (ref 13.0–17.0)
Lymphs Abs: 1.9 10*3/uL (ref 0.7–4.0)
MCH: 28.9 pg (ref 26.0–34.0)
MCHC: 33.2 g/dL (ref 30.0–36.0)
Neutro Abs: 4.3 10*3/uL (ref 1.7–7.7)
Neutrophils Relative %: 64 % (ref 43–77)
Platelets: 243 10*3/uL (ref 150–400)
RBC: 4.91 MIL/uL (ref 4.22–5.81)

## 2012-04-08 LAB — T-HELPER CELL (CD4) - (RCID CLINIC ONLY): CD4 % Helper T Cell: 21 % — ABNORMAL LOW (ref 33–55)

## 2012-04-09 LAB — HIV-1 RNA QUANT-NO REFLEX-BLD
HIV 1 RNA Quant: <20 copies/mL (ref <20)
HIV-1 RNA Quant, Log: <1.3 {Log} (ref <1.30)

## 2012-04-20 ENCOUNTER — Ambulatory Visit (INDEPENDENT_AMBULATORY_CARE_PROVIDER_SITE_OTHER): Payer: Medicare Other | Admitting: Infectious Disease

## 2012-04-20 ENCOUNTER — Encounter: Payer: Self-pay | Admitting: Infectious Disease

## 2012-04-20 VITALS — BP 133/77 | HR 70 | Temp 97.8°F | Wt 212.0 lb

## 2012-04-20 DIAGNOSIS — K831 Obstruction of bile duct: Secondary | ICD-10-CM

## 2012-04-20 DIAGNOSIS — B20 Human immunodeficiency virus [HIV] disease: Secondary | ICD-10-CM

## 2012-04-20 NOTE — Assessment & Plan Note (Signed)
Perfect control 

## 2012-04-20 NOTE — Assessment & Plan Note (Signed)
Stents out, followed by Dr. Marina Goodell. Alk phosph slightly up at times but transaminases normal

## 2012-04-20 NOTE — Progress Notes (Signed)
  Subjective:    Patient ID: Jay Hoffman, male    DOB: 08-05-1946, 66 y.o.   MRN: [id number]  HPI  66 y.o. male who is doing superbly well on their antiviral regimen, atripla with undetectable viral load and health cd4 count. His LFTS are stable with stents out. He has no specific complaints today.  Review of Systems  Constitutional: Negative for fever, chills, diaphoresis, activity change, appetite change, fatigue and unexpected weight change.  HENT: Negative for congestion, sore throat, rhinorrhea, sneezing, trouble swallowing and sinus pressure.   Eyes: Negative for photophobia and visual disturbance.  Respiratory: Negative for cough, chest tightness, shortness of breath, wheezing and stridor.   Cardiovascular: Negative for chest pain, palpitations and leg swelling.  Gastrointestinal: Negative for nausea, vomiting, abdominal pain, diarrhea, constipation, blood in stool, abdominal distention and anal bleeding.  Genitourinary: Negative for dysuria, hematuria, flank pain and difficulty urinating.  Musculoskeletal: Negative for myalgias, back pain, joint swelling, arthralgias and gait problem.  Skin: Negative for color change, pallor, rash and wound.  Neurological: Negative for dizziness, tremors, weakness and light-headedness.  Hematological: Negative for adenopathy. Does not bruise/bleed easily.  Psychiatric/Behavioral: Negative for behavioral problems, confusion, sleep disturbance, dysphoric mood, decreased concentration and agitation.       Objective:   Physical Exam  Constitutional: He is oriented to person, place, and time. He appears well-developed and well-nourished. No distress.  HENT:  Head: Normocephalic and atraumatic.  Mouth/Throat: Oropharynx is clear and moist. No oropharyngeal exudate.  Eyes: Conjunctivae and EOM are normal. Pupils are equal, round, and reactive to light. No scleral icterus.  Neck: Normal range of motion. Neck supple. No JVD present.  Cardiovascular:  Normal rate, regular rhythm and normal heart sounds.  Exam reveals no gallop and no friction rub.   No murmur heard. Pulmonary/Chest: Effort normal and breath sounds normal. No respiratory distress. He has no wheezes. He has no rales. He exhibits no tenderness.  Abdominal: He exhibits no distension and no mass. There is no tenderness. There is no rebound and no guarding.  Musculoskeletal: He exhibits no edema and no tenderness.  Lymphadenopathy:    He has no cervical adenopathy.  Neurological: He is alert and oriented to person, place, and time. He has normal reflexes. He exhibits normal muscle tone. Coordination normal.  Skin: Skin is warm and dry. He is not diaphoretic. No erythema. No pallor.  Psychiatric: He has a normal mood and affect. His behavior is normal. Judgment and thought content normal.          Assessment & Plan:  HIV INFECTION Perfect control  BILE DUCT STRICTURE Stents out, followed by Dr. Marina Goodell. Alk phosph slightly up at times but transaminases normal

## 2012-05-07 IMAGING — RF DG ERCP WO/W SPHINCTEROTOMY
1 series · 1 of 1 positions shown · non-contrast
Comparison: 06/24/2010

CLINICAL DATA: Abdominal pain/biliary stent placement for bile leak

ERCP
TECHNIQUE: Fluoroscopy provided for gastroenterology.  Several
electronic spot images were obtained.

[Series 1: run · 1 of 1 slices shown]
[im 1/1]
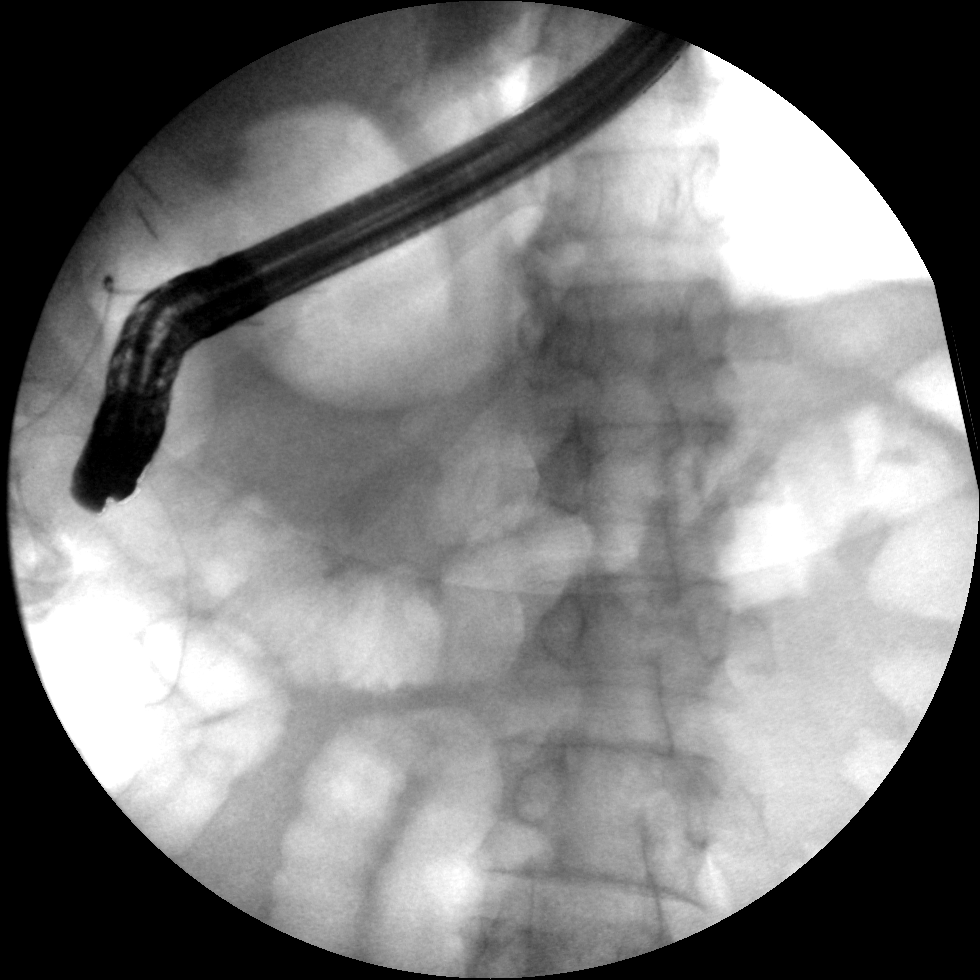

[1 of 1 positions shown; findings below may reference images not displayed]

FINDINGS: Injection of dilute contrast into the biliary tree, shows
extravasation from the region of the confluence of the right and
left hepatic ducts.  On final images, a non metallic stent has been
placed with drainage of the contrast.  Only minimal extravasated
contrast is still present at the ductal confluence.
IMPRESSION: ERCP with placement of an endobiliary stent for bile leak, as
described above.

## 2012-06-10 ENCOUNTER — Other Ambulatory Visit (INDEPENDENT_AMBULATORY_CARE_PROVIDER_SITE_OTHER): Payer: Medicare Other

## 2012-06-10 DIAGNOSIS — K831 Obstruction of bile duct: Secondary | ICD-10-CM

## 2012-06-10 LAB — HEPATIC FUNCTION PANEL
AST: 25 U/L (ref 0–37)
Alkaline Phosphatase: 113 U/L (ref 39–117)
Bilirubin, Direct: 0.1 mg/dL (ref 0.0–0.3)
Total Bilirubin: 0.4 mg/dL (ref 0.3–1.2)

## 2012-06-25 ENCOUNTER — Other Ambulatory Visit: Payer: Self-pay | Admitting: *Deleted

## 2012-06-25 DIAGNOSIS — E785 Hyperlipidemia, unspecified: Secondary | ICD-10-CM

## 2012-06-25 MED ORDER — PRAVASTATIN SODIUM 40 MG PO TABS: 40.0000 mg | ORAL_TABLET | Freq: Every day | ORAL | Status: DC

## 2012-10-12 ENCOUNTER — Other Ambulatory Visit: Payer: Medicare Other

## 2012-10-12 DIAGNOSIS — B20 Human immunodeficiency virus [HIV] disease: Secondary | ICD-10-CM

## 2012-10-12 LAB — CBC WITH DIFFERENTIAL/PLATELET
Eosinophils Absolute: 0.1 10*3/uL (ref 0.0–0.7)
Lymphocytes Relative: 33 % (ref 12–46)
Lymphs Abs: 1.8 10*3/uL (ref 0.7–4.0)
MCH: 29.2 pg (ref 26.0–34.0)
Neutro Abs: 3.2 10*3/uL (ref 1.7–7.7)
Neutrophils Relative %: 60 % (ref 43–77)
Platelets: 236 10*3/uL (ref 150–400)
RBC: 4.73 MIL/uL (ref 4.22–5.81)
WBC: 5.3 10*3/uL (ref 4.0–10.5)

## 2012-10-12 LAB — RPR

## 2012-10-12 LAB — COMPLETE METABOLIC PANEL WITH GFR
ALT: 19 U/L (ref 0–53)
Alkaline Phosphatase: 106 U/L (ref 39–117)
CO2: 23 mEq/L (ref 19–32)
GFR, Est African American: 63 mL/min
Sodium: 137 mEq/L (ref 135–145)
Total Bilirubin: 0.3 mg/dL (ref 0.3–1.2)
Total Protein: 7.9 g/dL (ref 6.0–8.3)

## 2012-10-12 LAB — LIPID PANEL
LDL Cholesterol: 100 mg/dL — ABNORMAL HIGH (ref 0–99)
Total CHOL/HDL Ratio: 5.3 Ratio
VLDL: 39 mg/dL (ref 0–40)

## 2012-10-13 LAB — T-HELPER CELL (CD4) - (RCID CLINIC ONLY): CD4 T Cell Abs: 350 uL — ABNORMAL LOW (ref 400–2700)

## 2012-10-26 ENCOUNTER — Ambulatory Visit (INDEPENDENT_AMBULATORY_CARE_PROVIDER_SITE_OTHER): Payer: Medicare Other | Admitting: Infectious Disease

## 2012-10-26 VITALS — Ht 65.0 in | Wt 212.8 lb

## 2012-10-26 DIAGNOSIS — K831 Obstruction of bile duct: Secondary | ICD-10-CM

## 2012-10-26 DIAGNOSIS — Z21 Asymptomatic human immunodeficiency virus [HIV] infection status: Secondary | ICD-10-CM

## 2012-10-26 DIAGNOSIS — N289 Disorder of kidney and ureter, unspecified: Secondary | ICD-10-CM

## 2012-10-26 DIAGNOSIS — B20 Human immunodeficiency virus [HIV] disease: Secondary | ICD-10-CM

## 2012-10-26 DIAGNOSIS — B1920 Unspecified viral hepatitis C without hepatic coma: Secondary | ICD-10-CM

## 2012-10-26 MED ORDER — EFAVIRENZ-EMTRICITAB-TENOFOVIR 600-200-300 MG PO TABS: 1.0000 | ORAL_TABLET | Freq: Every day | ORAL | Status: DC

## 2012-10-26 NOTE — Progress Notes (Signed)
  Subjective:    Patient ID: TU BAYLE, male    DOB: 10-30-1946, 66 y.o.   MRN: [id number]  HPI  Jay Hoffman is a 66 y.o. male who is doing superbly well on his  antiviral regimen, atripla with undetectable viral load and health cd4 count. His Cr has creeped a bit and we willl check hla 5701 at next visit. Otherwise not complaints today.     Review of Systems  Constitutional: Negative for fever, chills, diaphoresis, activity change, appetite change, fatigue and unexpected weight change.  HENT: Negative for congestion, sore throat, rhinorrhea, sneezing, trouble swallowing and sinus pressure.   Eyes: Negative for photophobia and visual disturbance.  Respiratory: Negative for cough, chest tightness, shortness of breath, wheezing and stridor.   Cardiovascular: Negative for chest pain, palpitations and leg swelling.  Gastrointestinal: Negative for nausea, vomiting, abdominal pain, diarrhea, constipation, blood in stool, abdominal distention and anal bleeding.  Genitourinary: Negative for dysuria, hematuria, flank pain and difficulty urinating.  Musculoskeletal: Negative for myalgias, back pain, joint swelling, arthralgias and gait problem.  Skin: Negative for color change, pallor, rash and wound.  Neurological: Negative for dizziness, tremors, weakness and light-headedness.  Hematological: Negative for adenopathy. Does not bruise/bleed easily.  Psychiatric/Behavioral: Negative for behavioral problems, confusion, sleep disturbance, dysphoric mood, decreased concentration and agitation.       Objective:   Physical Exam  Constitutional: He is oriented to person, place, and time. He appears well-developed and well-nourished. No distress.  HENT:  Head: Normocephalic and atraumatic.  Mouth/Throat: Oropharynx is clear and moist. No oropharyngeal exudate.  Eyes: Conjunctivae normal and EOM are normal. Pupils are equal, round, and reactive to light. No scleral icterus.  Neck: Normal range  of motion. Neck supple. No JVD present.  Cardiovascular: Normal rate, regular rhythm and normal heart sounds.  Exam reveals no gallop and no friction rub.   No murmur heard. Pulmonary/Chest: Effort normal and breath sounds normal. No respiratory distress. He has no wheezes. He has no rales. He exhibits no tenderness.  Abdominal: He exhibits no distension and no mass. There is no tenderness. There is no rebound and no guarding.  Musculoskeletal: He exhibits no edema and no tenderness.  Lymphadenopathy:    He has no cervical adenopathy.  Neurological: He is alert and oriented to person, place, and time. He has normal reflexes. He exhibits normal muscle tone. Coordination normal.  Skin: Skin is warm and dry. He is not diaphoretic. No erythema. No pallor.  Psychiatric: He has a normal mood and affect. His behavior is normal. Judgment and thought content normal.          Assessment & Plan:  HIV: perfect control. See above discussion re his cr creep  Renal insufficiency: check hla [id number] in future visit  BILE DUCT STRICTURE -  Stents out, followed by Dr. Marina Goodell. Alk phosph slightly up at times but transaminases normal

## 2012-12-07 ENCOUNTER — Other Ambulatory Visit (INDEPENDENT_AMBULATORY_CARE_PROVIDER_SITE_OTHER): Payer: Medicare Other

## 2012-12-07 ENCOUNTER — Other Ambulatory Visit: Payer: Self-pay | Admitting: Internal Medicine

## 2012-12-07 DIAGNOSIS — K831 Obstruction of bile duct: Secondary | ICD-10-CM

## 2012-12-07 DIAGNOSIS — R7989 Other specified abnormal findings of blood chemistry: Secondary | ICD-10-CM

## 2012-12-07 LAB — HEPATIC FUNCTION PANEL
AST: 24 U/L (ref 0–37)
Alkaline Phosphatase: 101 U/L (ref 39–117)
Bilirubin, Direct: 0 mg/dL (ref 0.0–0.3)
Total Bilirubin: 0.6 mg/dL (ref 0.3–1.2)
Total Protein: 8.1 g/dL (ref 6.0–8.3)

## 2013-04-14 ENCOUNTER — Other Ambulatory Visit: Payer: Medicare Other

## 2013-04-14 ENCOUNTER — Other Ambulatory Visit: Payer: Self-pay | Admitting: Infectious Disease

## 2013-04-14 DIAGNOSIS — B20 Human immunodeficiency virus [HIV] disease: Secondary | ICD-10-CM

## 2013-04-14 LAB — COMPLETE METABOLIC PANEL WITH GFR
ALT: 20 U/L (ref 0–53)
Albumin: 4.2 g/dL (ref 3.5–5.2)
CO2: 24 mEq/L (ref 19–32)
Calcium: 9.7 mg/dL (ref 8.4–10.5)
Chloride: 107 mEq/L (ref 96–112)
GFR, Est African American: 65 mL/min
Glucose, Bld: 99 mg/dL (ref 70–99)
Potassium: 4.4 mEq/L (ref 3.5–5.3)
Sodium: 138 mEq/L (ref 135–145)
Total Protein: 8 g/dL (ref 6.0–8.3)

## 2013-04-14 LAB — LIPID PANEL
Cholesterol: 193 mg/dL (ref 0–200)
LDL Cholesterol: 119 mg/dL — ABNORMAL HIGH (ref 0–99)
VLDL: 38 mg/dL (ref 0–40)

## 2013-04-14 LAB — CBC WITH DIFFERENTIAL/PLATELET
Basophils Absolute: 0 10*3/uL (ref 0.0–0.1)
Basophils Relative: 0 % (ref 0–1)
Eosinophils Absolute: 0.1 10*3/uL (ref 0.0–0.7)
Hemoglobin: 14.5 g/dL (ref 13.0–17.0)
MCH: 29.4 pg (ref 26.0–34.0)
MCHC: 34.9 g/dL (ref 30.0–36.0)
Monocytes Relative: 7 % (ref 3–12)
Neutrophils Relative %: 63 % (ref 43–77)
Platelets: 245 10*3/uL (ref 150–400)
RDW: 14.7 % (ref 11.5–15.5)

## 2013-04-15 LAB — HIV-1 RNA QUANT-NO REFLEX-BLD: HIV-1 RNA Quant, Log: <1.3 {Log} (ref <1.30)

## 2013-04-15 LAB — T-HELPER CELL (CD4) - (RCID CLINIC ONLY): CD4 T Cell Abs: 300 uL — ABNORMAL LOW (ref 400–2700)

## 2013-04-22 LAB — HLA B*5701

## 2013-04-28 ENCOUNTER — Ambulatory Visit (INDEPENDENT_AMBULATORY_CARE_PROVIDER_SITE_OTHER): Payer: Medicare Other | Admitting: Infectious Disease

## 2013-04-28 ENCOUNTER — Encounter: Payer: Self-pay | Admitting: Infectious Disease

## 2013-04-28 VITALS — BP 120/79 | HR 71 | Temp 98.3°F | Ht 69.0 in | Wt 212.0 lb

## 2013-04-28 DIAGNOSIS — N529 Male erectile dysfunction, unspecified: Secondary | ICD-10-CM

## 2013-04-28 DIAGNOSIS — K831 Obstruction of bile duct: Secondary | ICD-10-CM

## 2013-04-28 DIAGNOSIS — B20 Human immunodeficiency virus [HIV] disease: Secondary | ICD-10-CM

## 2013-04-28 MED ORDER — SILDENAFIL CITRATE 50 MG PO TABS: ORAL_TABLET | ORAL | Status: AC

## 2013-04-28 NOTE — Progress Notes (Signed)
  Subjective:    Patient ID: Jay Hoffman, male    DOB: 1946/04/09, 67 y.o.   MRN: [id number]  HPI   Jay Hoffman is a 67 y.o. male who is doing superbly well on his  antiviral regimen, atripla with undetectable viral load and health cd4 count. His Cr has creeped a bit and we DID check hla 5701 which has come down. Creatinine now stable. Otherwise not complaints today. I went to great detail with re to [employer] sponsored [facility] switch trial and he is interested in this. He wishes for refills on viagra. He is using condoms religiously and given  hem today in clinic.     Review of Systems  Constitutional: Negative for fever, chills, diaphoresis, activity change, appetite change, fatigue and unexpected weight change.  HENT: Negative for congestion, sore throat, rhinorrhea, sneezing, trouble swallowing and sinus pressure.   Eyes: Negative for photophobia and visual disturbance.  Respiratory: Negative for cough, chest tightness, shortness of breath, wheezing and stridor.   Cardiovascular: Negative for chest pain, palpitations and leg swelling.  Gastrointestinal: Negative for nausea, vomiting, abdominal pain, diarrhea, constipation, blood in stool, abdominal distention and anal bleeding.  Genitourinary: Negative for dysuria, hematuria, flank pain and difficulty urinating.  Musculoskeletal: Negative for myalgias, back pain, joint swelling, arthralgias and gait problem.  Skin: Negative for color change, pallor, rash and wound.  Neurological: Negative for dizziness, tremors, weakness and light-headedness.  Hematological: Negative for adenopathy. Does not bruise/bleed easily.  Psychiatric/Behavioral: Negative for behavioral problems, confusion, sleep disturbance, dysphoric mood, decreased concentration and agitation.       Objective:   Physical Exam  Constitutional: He is oriented to person, place, and time. He appears well-developed and well-nourished. No distress.  HENT:  Head:  Normocephalic and atraumatic.  Mouth/Throat: Oropharynx is clear and moist. No oropharyngeal exudate.  Eyes: Conjunctivae and EOM are normal. Pupils are equal, round, and reactive to light. No scleral icterus.  Neck: Normal range of motion. Neck supple. No JVD present.  Cardiovascular: Normal rate, regular rhythm and normal heart sounds.  Exam reveals no gallop and no friction rub.   No murmur heard. Pulmonary/Chest: Effort normal and breath sounds normal. No respiratory distress. He has no wheezes. He has no rales. He exhibits no tenderness.  Abdominal: He exhibits no distension and no mass. There is no tenderness. There is no rebound and no guarding.  Musculoskeletal: He exhibits no edema and no tenderness.  Lymphadenopathy:    He has no cervical adenopathy.  Neurological: He is alert and oriented to person, place, and time. He has normal reflexes. He exhibits normal muscle tone. Coordination normal.  Skin: Skin is warm and dry. He is not diaphoretic. No erythema. No pallor.  Psychiatric: He has a normal mood and affect. His behavior is normal. Judgment and thought content normal.          Assessment & Plan:  HIV: perfect control consider for STRIIVING, He is HLA b701 negative  BILE DUCT STRICTURE -  Stents out, followed by Dr. Marina Goodell. Alk phosph slightly up at times but transaminases normal  ED: viagra rx for him

## 2013-06-10 ENCOUNTER — Other Ambulatory Visit: Payer: Self-pay

## 2013-06-10 ENCOUNTER — Other Ambulatory Visit (INDEPENDENT_AMBULATORY_CARE_PROVIDER_SITE_OTHER): Payer: Medicare Other

## 2013-06-10 DIAGNOSIS — K831 Obstruction of bile duct: Secondary | ICD-10-CM

## 2013-06-10 DIAGNOSIS — R7989 Other specified abnormal findings of blood chemistry: Secondary | ICD-10-CM

## 2013-06-10 LAB — HEPATIC FUNCTION PANEL
Alkaline Phosphatase: 100 U/L (ref 39–117)
Bilirubin, Direct: 0 mg/dL (ref 0.0–0.3)
Total Bilirubin: 0.3 mg/dL (ref 0.3–1.2)

## 2013-06-23 ENCOUNTER — Other Ambulatory Visit: Payer: Self-pay | Admitting: Licensed Clinical Social Worker

## 2013-06-23 DIAGNOSIS — E785 Hyperlipidemia, unspecified: Secondary | ICD-10-CM

## 2013-06-23 MED ORDER — PRAVASTATIN SODIUM 40 MG PO TABS: 40.0000 mg | ORAL_TABLET | Freq: Every day | ORAL | Status: DC

## 2013-07-07 ENCOUNTER — Other Ambulatory Visit: Payer: Self-pay

## 2013-10-07 ENCOUNTER — Other Ambulatory Visit: Payer: Self-pay

## 2013-10-18 ENCOUNTER — Other Ambulatory Visit (HOSPITAL_COMMUNITY)
Admission: RE | Admit: 2013-10-18 | Discharge: 2013-10-18 | Disposition: A | Discharge status: Home / Self Care | Payer: Medicare Other | Source: Ambulatory Visit | Attending: Infectious Disease | Admitting: Infectious Disease

## 2013-10-18 ENCOUNTER — Other Ambulatory Visit: Payer: Medicare Other

## 2013-10-18 DIAGNOSIS — Z113 Encounter for screening for infections with a predominantly sexual mode of transmission: Secondary | ICD-10-CM

## 2013-10-18 DIAGNOSIS — B20 Human immunodeficiency virus [HIV] disease: Secondary | ICD-10-CM

## 2013-10-18 LAB — COMPLETE METABOLIC PANEL WITH GFR
ALT: 15 U/L (ref 0–53)
AST: 19 U/L (ref 0–37)
Albumin: 3.9 g/dL (ref 3.5–5.2)
CO2: 23 mEq/L (ref 19–32)
Calcium: 9.1 mg/dL (ref 8.4–10.5)
Chloride: 107 mEq/L (ref 96–112)
GFR, Est African American: 68 mL/min
Potassium: 4 mEq/L (ref 3.5–5.3)
Sodium: 136 mEq/L (ref 135–145)
Total Protein: 7.4 g/dL (ref 6.0–8.3)

## 2013-10-18 LAB — CBC WITH DIFFERENTIAL/PLATELET
Basophils Relative: 0 % (ref 0–1)
Eosinophils Absolute: 0.1 10*3/uL (ref 0.0–0.7)
Hemoglobin: 13.9 g/dL (ref 13.0–17.0)
Lymphocytes Relative: 38 % (ref 12–46)
MCH: 29 pg (ref 26.0–34.0)
MCHC: 34.5 g/dL (ref 30.0–36.0)
Monocytes Relative: 6 % (ref 3–12)
Neutro Abs: 3.1 10*3/uL (ref 1.7–7.7)
Neutrophils Relative %: 55 % (ref 43–77)
Platelets: 233 10*3/uL (ref 150–400)
RDW: 13.7 % (ref 11.5–15.5)

## 2013-10-19 LAB — T-HELPER CELL (CD4) - (RCID CLINIC ONLY)
CD4 % Helper T Cell: 19 % — ABNORMAL LOW (ref 33–55)
CD4 T Cell Abs: 460 /uL (ref 400–2700)

## 2013-10-21 LAB — HIV-1 RNA QUANT-NO REFLEX-BLD
HIV 1 RNA Quant: <20 copies/mL (ref <20)
HIV-1 RNA Quant, Log: <1.3 {Log} (ref <1.30)

## 2013-11-01 ENCOUNTER — Ambulatory Visit (INDEPENDENT_AMBULATORY_CARE_PROVIDER_SITE_OTHER): Payer: Medicare Other | Admitting: Infectious Disease

## 2013-11-01 ENCOUNTER — Encounter: Payer: Self-pay | Admitting: Infectious Disease

## 2013-11-01 VITALS — BP 127/78 | HR 65 | Temp 97.8°F | Wt 212.0 lb

## 2013-11-01 DIAGNOSIS — B20 Human immunodeficiency virus [HIV] disease: Secondary | ICD-10-CM

## 2013-11-01 DIAGNOSIS — Z23 Encounter for immunization: Secondary | ICD-10-CM

## 2013-11-01 DIAGNOSIS — Z1211 Encounter for screening for malignant neoplasm of colon: Secondary | ICD-10-CM

## 2013-11-01 NOTE — Progress Notes (Signed)
Subjective:    Patient ID: Jay Hoffman, male    DOB: Jan 10, 1946, 67 y.o.   MRN: [id number]  HPI   Jay Hoffman is a 67 y.o. male who is doing superbly well on his  antiviral regimen, atripla with undetectable viral load and health cd4 count. His Cr has creeped a bit and we DID check hla 5701 which has come down. Creatinine now stable. Otherwise not complaints today.He wishes for refills on viagra. He is using condoms religiously and given  hem today in clinic.     Review of Systems  Constitutional: Negative for fever, chills, diaphoresis, activity change, appetite change, fatigue and unexpected weight change.  HENT: Negative for congestion, rhinorrhea, sinus pressure, sneezing, sore throat and trouble swallowing.   Eyes: Negative for photophobia and visual disturbance.  Respiratory: Negative for cough, chest tightness, shortness of breath, wheezing and stridor.   Cardiovascular: Negative for chest pain, palpitations and leg swelling.  Gastrointestinal: Negative for nausea, vomiting, abdominal pain, diarrhea, constipation, blood in stool, abdominal distention and anal bleeding.  Genitourinary: Negative for dysuria, hematuria, flank pain and difficulty urinating.  Musculoskeletal: Negative for arthralgias, back pain, gait problem, joint swelling and myalgias.  Skin: Negative for color change, pallor, rash and wound.  Neurological: Negative for dizziness, tremors, weakness and light-headedness.  Hematological: Negative for adenopathy. Does not bruise/bleed easily.  Psychiatric/Behavioral: Negative for behavioral problems, confusion, sleep disturbance, dysphoric mood, decreased concentration and agitation.       Objective:   Physical Exam  Constitutional: He is oriented to person, place, and time. He appears well-developed and well-nourished. No distress.  HENT:  Head: Normocephalic and atraumatic.  Mouth/Throat: Oropharynx is clear and moist. No oropharyngeal exudate.  Eyes:  Conjunctivae and EOM are normal. Pupils are equal, round, and reactive to light. No scleral icterus.  Neck: Normal range of motion. Neck supple. No JVD present.  Cardiovascular: Normal rate, regular rhythm and normal heart sounds.  Exam reveals no gallop and no friction rub.   No murmur heard. Pulmonary/Chest: Effort normal and breath sounds normal. No respiratory distress. He has no wheezes. He has no rales. He exhibits no tenderness.  Abdominal: He exhibits no distension and no mass. There is no tenderness. There is no rebound and no guarding.  Musculoskeletal: He exhibits no edema and no tenderness.  Lymphadenopathy:    He has no cervical adenopathy.  Neurological: He is alert and oriented to person, place, and time. He has normal reflexes. He exhibits normal muscle tone. Coordination normal.  Skin: Skin is warm and dry. He is not diaphoretic. No erythema. No pallor.  Psychiatric: He has a normal mood and affect. His behavior is normal. Judgment and thought content normal.          Assessment & Plan:  HIV: perfect control.He is HLA b701 negative. Considered to change to [facility] but he is still happy on the Atripla  BILE DUCT STRICTURE -  Stents out, followed by Dr. Marina Goodell. Alk phosph slightly up at times but transaminases normal  ED: viagra rx for him   Need for implant vaccine vaccine up-to-date  Need for tetanus vaccine tetanus vaccine given today.  Need for zoster vaccine: This can be given at future date  Need for pneumonia vaccine will give him that dose at future visit as he was going to have the painful tetanus shot today.  Need for screening colonoscopy he believes he had a colonoscopy within the last 10 years because of followup with his primary care physician  regarding this.

## 2013-12-13 ENCOUNTER — Other Ambulatory Visit (INDEPENDENT_AMBULATORY_CARE_PROVIDER_SITE_OTHER): Payer: Managed Care, Other (non HMO)

## 2013-12-13 ENCOUNTER — Other Ambulatory Visit: Payer: Self-pay

## 2013-12-13 DIAGNOSIS — K831 Obstruction of bile duct: Secondary | ICD-10-CM

## 2013-12-13 DIAGNOSIS — R7989 Other specified abnormal findings of blood chemistry: Secondary | ICD-10-CM

## 2013-12-13 DIAGNOSIS — R945 Abnormal results of liver function studies: Principal | ICD-10-CM

## 2013-12-13 LAB — HEPATIC FUNCTION PANEL
ALT: 24 U/L (ref 0–53)
AST: 24 U/L (ref 0–37)
Albumin: 3.8 g/dL (ref 3.5–5.2)
Alkaline Phosphatase: 109 U/L (ref 39–117)
Bilirubin, Direct: 0 mg/dL (ref 0.0–0.3)
Total Bilirubin: 0.3 mg/dL (ref 0.3–1.2)
Total Protein: 7.8 g/dL (ref 6.0–8.3)

## 2013-12-27 DIAGNOSIS — R972 Elevated prostate specific antigen [PSA]: Secondary | ICD-10-CM | POA: Insufficient documentation

## 2014-01-06 ENCOUNTER — Telehealth: Payer: Self-pay | Admitting: Internal Medicine

## 2014-01-21 ENCOUNTER — Other Ambulatory Visit: Payer: Self-pay | Admitting: *Deleted

## 2014-01-21 DIAGNOSIS — B20 Human immunodeficiency virus [HIV] disease: Secondary | ICD-10-CM

## 2014-01-21 MED ORDER — EFAVIRENZ-EMTRICITAB-TENOFOVIR 600-200-300 MG PO TABS: 1.0000 | ORAL_TABLET | Freq: Every day | ORAL | Status: DC

## 2014-05-02 ENCOUNTER — Other Ambulatory Visit (INDEPENDENT_AMBULATORY_CARE_PROVIDER_SITE_OTHER): Payer: Managed Care, Other (non HMO)

## 2014-05-02 DIAGNOSIS — Z79899 Other long term (current) drug therapy: Secondary | ICD-10-CM

## 2014-05-02 DIAGNOSIS — B20 Human immunodeficiency virus [HIV] disease: Secondary | ICD-10-CM

## 2014-05-02 LAB — COMPLETE METABOLIC PANEL WITH GFR
ALBUMIN: 4.1 g/dL (ref 3.5–5.2)
ALK PHOS: 108 U/L (ref 39–117)
ALT: 14 U/L (ref 0–53)
AST: 19 U/L (ref 0–37)
BUN: 18 mg/dL (ref 6–23)
CO2: 25 mEq/L (ref 19–32)
Calcium: 9.2 mg/dL (ref 8.4–10.5)
Chloride: 107 mEq/L (ref 96–112)
Creat: 1.29 mg/dL (ref 0.50–1.35)
GFR, Est African American: 66 mL/min
GFR, Est Non African American: 57 mL/min — ABNORMAL LOW
Glucose, Bld: 101 mg/dL — ABNORMAL HIGH (ref 70–99)
POTASSIUM: 4.5 meq/L (ref 3.5–5.3)
SODIUM: 138 meq/L (ref 135–145)
TOTAL PROTEIN: 7.7 g/dL (ref 6.0–8.3)
Total Bilirubin: 0.3 mg/dL (ref 0.2–1.2)

## 2014-05-02 LAB — CBC WITH DIFFERENTIAL/PLATELET
BASOS ABS: 0 10*3/uL (ref 0.0–0.1)
Basophils Relative: 0 % (ref 0–1)
Eosinophils Absolute: 0.1 10*3/uL (ref 0.0–0.7)
Eosinophils Relative: 1 % (ref 0–5)
HCT: 39.7 % (ref 39.0–52.0)
HEMOGLOBIN: 13.7 g/dL (ref 13.0–17.0)
Lymphocytes Relative: 34 % (ref 12–46)
Lymphs Abs: 2 10*3/uL (ref 0.7–4.0)
MCH: 29.1 pg (ref 26.0–34.0)
MCHC: 34.5 g/dL (ref 30.0–36.0)
MCV: 84.3 fL (ref 78.0–100.0)
MONOS PCT: 6 % (ref 3–12)
Monocytes Absolute: 0.4 10*3/uL (ref 0.1–1.0)
NEUTROS PCT: 59 % (ref 43–77)
Neutro Abs: 3.5 10*3/uL (ref 1.7–7.7)
Platelets: 238 10*3/uL (ref 150–400)
RBC: 4.71 MIL/uL (ref 4.22–5.81)
RDW: 15.2 % (ref 11.5–15.5)
WBC: 6 10*3/uL (ref 4.0–10.5)

## 2014-05-02 LAB — LIPID PANEL
CHOL/HDL RATIO: 5.3 ratio
Cholesterol: 175 mg/dL (ref 0–200)
HDL: 33 mg/dL — ABNORMAL LOW (ref >39)
LDL Cholesterol: 113 mg/dL — ABNORMAL HIGH (ref 0–99)
Triglycerides: 147 mg/dL (ref <150)
VLDL: 29 mg/dL (ref 0–40)

## 2014-05-02 NOTE — Addendum Note (Signed)
Addended by: Mariea Clonts D on: 05/02/2014 10:37 AM   Modules accepted: Orders

## 2014-05-02 NOTE — Addendum Note (Signed)
Addended by: Mariea Clonts D on: 05/02/2014 10:39 AM   Modules accepted: Orders

## 2014-05-03 LAB — HEPATITIS C ANTIBODY: HCV AB: NEGATIVE

## 2014-05-03 LAB — HIV-1 RNA QUANT-NO REFLEX-BLD
HIV 1 RNA Quant: <20 copies/mL (ref <20)
HIV-1 RNA Quant, Log: <1.3 {Log} (ref <1.30)

## 2014-05-03 LAB — T-HELPER CELL (CD4) - (RCID CLINIC ONLY)
CD4 % Helper T Cell: 22 % — ABNORMAL LOW (ref 33–55)
CD4 T CELL ABS: 480 /uL (ref 400–2700)

## 2014-05-16 ENCOUNTER — Ambulatory Visit (INDEPENDENT_AMBULATORY_CARE_PROVIDER_SITE_OTHER): Payer: Managed Care, Other (non HMO) | Admitting: Infectious Disease

## 2014-05-16 ENCOUNTER — Encounter: Payer: Self-pay | Admitting: Infectious Disease

## 2014-05-16 VITALS — BP 121/73 | HR 71 | Temp 98.0°F | Wt 212.0 lb

## 2014-05-16 DIAGNOSIS — K831 Obstruction of bile duct: Secondary | ICD-10-CM

## 2014-05-16 DIAGNOSIS — E785 Hyperlipidemia, unspecified: Secondary | ICD-10-CM

## 2014-05-16 DIAGNOSIS — B20 Human immunodeficiency virus [HIV] disease: Secondary | ICD-10-CM

## 2014-05-16 DIAGNOSIS — Z113 Encounter for screening for infections with a predominantly sexual mode of transmission: Secondary | ICD-10-CM

## 2014-05-16 NOTE — Progress Notes (Signed)
  Subjective:    Patient ID: Jay Hoffman, male    DOB: [birth year], 68 y.o.   MRN: [id number]  HPI   Jay Hoffman is a 68 y.o. male who is doing superbly well on his  antiviral regimen, atripla with undetectable viral load and health cd4 count. His Cr is now stable.      Review of Systems  Constitutional: Negative for fever, chills, diaphoresis, activity change, appetite change, fatigue and unexpected weight change.  HENT: Negative for congestion, rhinorrhea, sinus pressure, sneezing, sore throat and trouble swallowing.   Eyes: Negative for photophobia and visual disturbance.  Respiratory: Negative for cough, chest tightness, shortness of breath, wheezing and stridor.   Cardiovascular: Negative for chest pain, palpitations and leg swelling.  Gastrointestinal: Negative for nausea, vomiting, abdominal pain, diarrhea, constipation, blood in stool, abdominal distention and anal bleeding.  Genitourinary: Negative for dysuria, hematuria, flank pain and difficulty urinating.  Musculoskeletal: Negative for arthralgias, back pain, gait problem, joint swelling and myalgias.  Skin: Negative for color change, pallor, rash and wound.  Neurological: Negative for dizziness, tremors, weakness and light-headedness.  Hematological: Negative for adenopathy. Does not bruise/bleed easily.  Psychiatric/Behavioral: Negative for behavioral problems, confusion, sleep disturbance, dysphoric mood, decreased concentration and agitation.       Objective:   Physical Exam  Constitutional: He is oriented to person, place, and time. He appears well-developed and well-nourished. No distress.  HENT:  Head: Normocephalic and atraumatic.  Mouth/Throat: Oropharynx is clear and moist. No oropharyngeal exudate.  Eyes: Conjunctivae and EOM are normal. Pupils are equal, round, and reactive to light. No scleral icterus.  Neck: Normal range of motion. Neck supple. No JVD present.  Cardiovascular: Normal rate, regular  rhythm and normal heart sounds.  Exam reveals no gallop and no friction rub.   No murmur heard. Pulmonary/Chest: Effort normal and breath sounds normal. No respiratory distress. He has no wheezes. He has no rales. He exhibits no tenderness.  Abdominal: He exhibits no distension and no mass. There is no tenderness. There is no rebound and no guarding.  Musculoskeletal: He exhibits no edema and no tenderness.  Lymphadenopathy:    He has no cervical adenopathy.  Neurological: He is alert and oriented to person, place, and time. He has normal reflexes. He exhibits normal muscle tone. Coordination normal.  Skin: Skin is warm and dry. He is not diaphoretic. No erythema. No pallor.  Psychiatric: He has a normal mood and affect. His behavior is normal. Judgment and thought content normal.          Assessment & Plan:  HIV: perfect control.He is HLA b701 negative. Considered to change to [facility] but he is still happy on the [place] -  Stents out, followed by Dr. Henrene Pastor. q 6 months ED: viagra rx for him  Need for screening colonoscopy he believes he had a colonoscopy within the last 10 years because of followup with his primary care physician regarding this.

## 2014-06-06 ENCOUNTER — Telehealth: Payer: Self-pay

## 2014-06-06 NOTE — Telephone Encounter (Signed)
Message copied by Chrystie NoseHUNT, Axel Frisk R on Mon Jun 06, 2014 10:44 AM ------      Message from: Markasia Carrol R      Created: Mon Dec 13, 2013 12:04 PM       Pt needs LFT's, order in epic. ------

## 2014-06-06 NOTE — Telephone Encounter (Signed)
Pt will come for labs this week.

## 2014-06-08 ENCOUNTER — Other Ambulatory Visit (INDEPENDENT_AMBULATORY_CARE_PROVIDER_SITE_OTHER): Payer: Managed Care, Other (non HMO)

## 2014-06-08 ENCOUNTER — Other Ambulatory Visit: Payer: Self-pay

## 2014-06-08 DIAGNOSIS — R7989 Other specified abnormal findings of blood chemistry: Secondary | ICD-10-CM

## 2014-06-08 DIAGNOSIS — R945 Abnormal results of liver function studies: Principal | ICD-10-CM

## 2014-06-08 LAB — HEPATIC FUNCTION PANEL
ALBUMIN: 4 g/dL (ref 3.5–5.2)
ALT: 17 U/L (ref 0–53)
AST: 21 U/L (ref 0–37)
Alkaline Phosphatase: 114 U/L (ref 39–117)
BILIRUBIN DIRECT: 0 mg/dL (ref 0.0–0.3)
TOTAL PROTEIN: 8.3 g/dL (ref 6.0–8.3)
Total Bilirubin: 0.4 mg/dL (ref 0.2–1.2)

## 2014-06-14 ENCOUNTER — Other Ambulatory Visit: Payer: Self-pay | Admitting: *Deleted

## 2014-06-14 DIAGNOSIS — E785 Hyperlipidemia, unspecified: Secondary | ICD-10-CM

## 2014-06-14 MED ORDER — PRAVASTATIN SODIUM 40 MG PO TABS: 40.0000 mg | ORAL_TABLET | Freq: Every day | ORAL | Status: DC

## 2014-12-05 ENCOUNTER — Telehealth: Payer: Self-pay

## 2014-12-05 NOTE — Telephone Encounter (Signed)
-----   Message from Lily Lovings, RN sent at 06/08/2014  2:41 PM EDT ----- Regarding: Labs Pt need LFT's in Jan, order in epic.

## 2014-12-05 NOTE — Telephone Encounter (Signed)
Pt aware.

## 2014-12-07 ENCOUNTER — Other Ambulatory Visit (INDEPENDENT_AMBULATORY_CARE_PROVIDER_SITE_OTHER): Payer: Managed Care, Other (non HMO)

## 2014-12-07 DIAGNOSIS — R7989 Other specified abnormal findings of blood chemistry: Secondary | ICD-10-CM

## 2014-12-07 DIAGNOSIS — R945 Abnormal results of liver function studies: Principal | ICD-10-CM

## 2014-12-07 LAB — HEPATIC FUNCTION PANEL
ALBUMIN: 4.1 g/dL (ref 3.5–5.2)
ALT: 22 U/L (ref 0–53)
AST: 25 U/L (ref 0–37)
Alkaline Phosphatase: 109 U/L (ref 39–117)
BILIRUBIN TOTAL: 0.4 mg/dL (ref 0.2–1.2)
Bilirubin, Direct: 0.1 mg/dL (ref 0.0–0.3)
TOTAL PROTEIN: 8.2 g/dL (ref 6.0–8.3)

## 2014-12-13 ENCOUNTER — Other Ambulatory Visit (INDEPENDENT_AMBULATORY_CARE_PROVIDER_SITE_OTHER): Payer: Managed Care, Other (non HMO)

## 2014-12-13 DIAGNOSIS — B20 Human immunodeficiency virus [HIV] disease: Secondary | ICD-10-CM

## 2014-12-13 DIAGNOSIS — E785 Hyperlipidemia, unspecified: Secondary | ICD-10-CM

## 2014-12-13 DIAGNOSIS — Z113 Encounter for screening for infections with a predominantly sexual mode of transmission: Secondary | ICD-10-CM

## 2014-12-13 LAB — CBC WITH DIFFERENTIAL/PLATELET
BASOS ABS: 0 10*3/uL (ref 0.0–0.1)
Basophils Relative: 0 % (ref 0–1)
EOS PCT: 1 % (ref 0–5)
Eosinophils Absolute: 0.1 10*3/uL (ref 0.0–0.7)
HCT: 39.1 % (ref 39.0–52.0)
Hemoglobin: 13.4 g/dL (ref 13.0–17.0)
Lymphocytes Relative: 32 % (ref 12–46)
Lymphs Abs: 1.6 10*3/uL (ref 0.7–4.0)
MCH: 29.1 pg (ref 26.0–34.0)
MCHC: 34.3 g/dL (ref 30.0–36.0)
MCV: 85 fL (ref 78.0–100.0)
MONO ABS: 0.3 10*3/uL (ref 0.1–1.0)
MPV: 8.4 fL — ABNORMAL LOW (ref 8.6–12.4)
Monocytes Relative: 6 % (ref 3–12)
Neutro Abs: 3.1 10*3/uL (ref 1.7–7.7)
Neutrophils Relative %: 61 % (ref 43–77)
PLATELETS: 242 10*3/uL (ref 150–400)
RBC: 4.6 MIL/uL (ref 4.22–5.81)
RDW: 15.3 % (ref 11.5–15.5)
WBC: 5.1 10*3/uL (ref 4.0–10.5)

## 2014-12-13 LAB — COMPLETE METABOLIC PANEL WITH GFR
ALBUMIN: 3.8 g/dL (ref 3.5–5.2)
ALT: 14 U/L (ref 0–53)
AST: 19 U/L (ref 0–37)
Alkaline Phosphatase: 102 U/L (ref 39–117)
BUN: 12 mg/dL (ref 6–23)
CO2: 24 mEq/L (ref 19–32)
Calcium: 9 mg/dL (ref 8.4–10.5)
Chloride: 107 mEq/L (ref 96–112)
Creat: 1.3 mg/dL (ref 0.50–1.35)
GFR, EST NON AFRICAN AMERICAN: 56 mL/min — AB
GFR, Est African American: 65 mL/min
Glucose, Bld: 97 mg/dL (ref 70–99)
POTASSIUM: 4.5 meq/L (ref 3.5–5.3)
Sodium: 138 mEq/L (ref 135–145)
TOTAL PROTEIN: 7.3 g/dL (ref 6.0–8.3)
Total Bilirubin: 0.3 mg/dL (ref 0.2–1.2)

## 2014-12-13 LAB — LIPID PANEL
Cholesterol: 151 mg/dL (ref 0–200)
HDL: 35 mg/dL — AB (ref >39)
LDL Cholesterol: 92 mg/dL (ref 0–99)
TRIGLYCERIDES: 121 mg/dL (ref <150)
Total CHOL/HDL Ratio: 4.3 Ratio
VLDL: 24 mg/dL (ref 0–40)

## 2014-12-14 LAB — URINE CYTOLOGY ANCILLARY ONLY
Chlamydia: NEGATIVE
Neisseria Gonorrhea: NEGATIVE

## 2014-12-14 LAB — MICROALBUMIN / CREATININE URINE RATIO
CREATININE, URINE: 211.4 mg/dL
MICROALB/CREAT RATIO: 14.2 mg/g (ref 0.0–30.0)
Microalb, Ur: 3 mg/dL — ABNORMAL HIGH (ref <2.0)

## 2014-12-14 LAB — HIV-1 RNA QUANT-NO REFLEX-BLD: HIV-1 RNA Quant, Log: <1.3 {Log} (ref <1.30)

## 2014-12-14 LAB — T-HELPER CELL (CD4) - (RCID CLINIC ONLY)
CD4 T CELL HELPER: 20 % — AB (ref 33–55)
CD4 T Cell Abs: 350 /uL — ABNORMAL LOW (ref 400–2700)

## 2014-12-14 LAB — RPR

## 2014-12-26 ENCOUNTER — Ambulatory Visit (INDEPENDENT_AMBULATORY_CARE_PROVIDER_SITE_OTHER): Payer: Managed Care, Other (non HMO) | Admitting: Infectious Disease

## 2014-12-26 ENCOUNTER — Encounter: Payer: Self-pay | Admitting: Infectious Disease

## 2014-12-26 VITALS — BP 121/75 | HR 78 | Temp 98.5°F | Wt 212.0 lb

## 2014-12-26 DIAGNOSIS — Z21 Asymptomatic human immunodeficiency virus [HIV] infection status: Secondary | ICD-10-CM

## 2014-12-26 DIAGNOSIS — B20 Human immunodeficiency virus [HIV] disease: Secondary | ICD-10-CM

## 2014-12-26 DIAGNOSIS — N529 Male erectile dysfunction, unspecified: Secondary | ICD-10-CM

## 2014-12-26 DIAGNOSIS — K831 Obstruction of bile duct: Secondary | ICD-10-CM

## 2014-12-26 MED ORDER — EFAVIRENZ-EMTRICITAB-TENOFOVIR 600-200-300 MG PO TABS
1.0000 | ORAL_TABLET | Freq: Every day | ORAL | Status: DC
Start: 2014-12-26 — End: 2015-07-24

## 2014-12-26 NOTE — Progress Notes (Signed)
  Subjective:    Patient ID: Jay Hoffman, male    DOB: [birth year], 69 y.o.   MRN: [id number]  HPI   Jay Hoffman is a 69 y.o. 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 His Cr is now stable.  He also had bile duct stricture that required stent placement in the past.      Review of Systems  Constitutional: Negative for fever, chills, diaphoresis, activity change, appetite change, fatigue and unexpected weight change.  HENT: Negative for congestion, rhinorrhea, sinus pressure, sneezing, sore throat and trouble swallowing.   Eyes: Negative for photophobia and visual disturbance.  Respiratory: Negative for cough, chest tightness, shortness of breath, wheezing and stridor.   Cardiovascular: Negative for chest pain, palpitations and leg swelling.  Gastrointestinal: Negative for nausea, vomiting, abdominal pain, diarrhea, constipation, blood in stool, abdominal distention and anal bleeding.  Genitourinary: Negative for dysuria, hematuria, flank pain and difficulty urinating.  Musculoskeletal: Negative for myalgias, back pain, joint swelling, arthralgias and gait problem.  Skin: Negative for color change, pallor, rash and wound.  Neurological: Negative for dizziness, tremors, weakness and light-headedness.  Hematological: Negative for adenopathy. Does not bruise/bleed easily.  Psychiatric/Behavioral: Negative for behavioral problems, confusion, sleep disturbance, dysphoric mood, decreased concentration and agitation.       Objective:   Physical Exam  Constitutional: He is oriented to person, place, and time. He appears well-developed and well-nourished. No distress.  HENT:  Head: Normocephalic and atraumatic.  Mouth/Throat: Oropharynx is clear and moist. No oropharyngeal exudate.  Eyes: Conjunctivae and EOM are normal. Pupils are  equal, round, and reactive to light. No scleral icterus.  Neck: Normal range of motion. Neck supple. No JVD present.  Cardiovascular: Normal rate, regular rhythm and normal heart sounds.  Exam reveals no gallop and no friction rub.   No murmur heard. Pulmonary/Chest: Effort normal and breath sounds normal. No respiratory distress. He has no wheezes. He has no rales. He exhibits no tenderness.  Abdominal: He exhibits no distension and no mass. There is no tenderness. There is no rebound and no guarding.  Musculoskeletal: He exhibits no edema or tenderness.  Lymphadenopathy:    He has no cervical adenopathy.  Neurological: He is alert and oriented to person, place, and time. He has normal reflexes. He exhibits normal muscle tone. Coordination normal.  Skin: Skin is warm and dry. He is not diaphoretic. No erythema. No pallor.  Psychiatric: He has a normal mood and affect. His behavior is normal. Judgment and thought content normal.          Assessment & Plan:  HIV: perfect control.He is HLA b701 negative. Considered to change to [facility] or TAF based TNF regimen (GENVOYA or R-TAF) in future. I spent greater than 25 minutes with the patient including greater than 50% of time in face to face counsel of the patient and in coordination of their care.   BILE DUCT STRICTURE -  Stents out, followed by Dr. Henrene Pastor. q 6 months  ED: viagra rx for him

## 2015-06-14 ENCOUNTER — Telehealth: Payer: Self-pay

## 2015-06-14 NOTE — Telephone Encounter (Signed)
-----   Message from Lily LovingsLinda Ray Hunt, RN sent at 12/08/2014  2:33 PM EST ----- Regarding: LFT's Pt needs LFT's in 6mth, order in epic.

## 2015-06-14 NOTE — Telephone Encounter (Signed)
Pt aware.

## 2015-06-15 ENCOUNTER — Other Ambulatory Visit (INDEPENDENT_AMBULATORY_CARE_PROVIDER_SITE_OTHER): Payer: Managed Care, Other (non HMO)

## 2015-06-15 DIAGNOSIS — R945 Abnormal results of liver function studies: Principal | ICD-10-CM

## 2015-06-15 DIAGNOSIS — R7989 Other specified abnormal findings of blood chemistry: Secondary | ICD-10-CM

## 2015-06-15 LAB — HEPATIC FUNCTION PANEL
ALK PHOS: 110 U/L (ref 39–117)
ALT: 16 U/L (ref 0–53)
AST: 20 U/L (ref 0–37)
Albumin: 4.1 g/dL (ref 3.5–5.2)
BILIRUBIN DIRECT: 0.1 mg/dL (ref 0.0–0.3)
TOTAL PROTEIN: 8.1 g/dL (ref 6.0–8.3)
Total Bilirubin: 0.3 mg/dL (ref 0.2–1.2)

## 2015-06-16 ENCOUNTER — Other Ambulatory Visit: Payer: Self-pay

## 2015-06-16 DIAGNOSIS — R945 Abnormal results of liver function studies: Principal | ICD-10-CM

## 2015-06-16 DIAGNOSIS — R7989 Other specified abnormal findings of blood chemistry: Secondary | ICD-10-CM

## 2015-07-11 ENCOUNTER — Other Ambulatory Visit: Payer: Managed Care, Other (non HMO)

## 2015-07-11 DIAGNOSIS — B20 Human immunodeficiency virus [HIV] disease: Secondary | ICD-10-CM

## 2015-07-11 LAB — COMPLETE METABOLIC PANEL WITH GFR
ALT: 15 U/L (ref 9–46)
AST: 19 U/L (ref 10–35)
Albumin: 4.1 g/dL (ref 3.6–5.1)
Alkaline Phosphatase: 98 U/L (ref 40–115)
BUN: 17 mg/dL (ref 7–25)
CALCIUM: 9.3 mg/dL (ref 8.6–10.3)
CHLORIDE: 110 mmol/L (ref 98–110)
CO2: 23 mmol/L (ref 20–31)
Creat: 1.23 mg/dL (ref 0.70–1.25)
GFR, EST AFRICAN AMERICAN: 69 mL/min (ref >=60)
GFR, Est Non African American: 60 mL/min (ref >=60)
Glucose, Bld: 106 mg/dL — ABNORMAL HIGH (ref 65–99)
POTASSIUM: 4.6 mmol/L (ref 3.5–5.3)
Sodium: 144 mmol/L (ref 135–146)
TOTAL PROTEIN: 7.7 g/dL (ref 6.1–8.1)
Total Bilirubin: 0.3 mg/dL (ref 0.2–1.2)

## 2015-07-11 LAB — CBC WITH DIFFERENTIAL/PLATELET
Basophils Absolute: 0 10*3/uL (ref 0.0–0.1)
Basophils Relative: 0 % (ref 0–1)
EOS ABS: 0.1 10*3/uL (ref 0.0–0.7)
Eosinophils Relative: 1 % (ref 0–5)
HEMATOCRIT: 41.9 % (ref 39.0–52.0)
Hemoglobin: 14.2 g/dL (ref 13.0–17.0)
LYMPHS ABS: 1.4 10*3/uL (ref 0.7–4.0)
Lymphocytes Relative: 25 % (ref 12–46)
MCH: 29.5 pg (ref 26.0–34.0)
MCHC: 33.9 g/dL (ref 30.0–36.0)
MCV: 87.1 fL (ref 78.0–100.0)
MPV: 8.8 fL (ref 8.6–12.4)
Monocytes Absolute: 0.2 10*3/uL (ref 0.1–1.0)
Monocytes Relative: 4 % (ref 3–12)
Neutro Abs: 3.8 10*3/uL (ref 1.7–7.7)
Neutrophils Relative %: 70 % (ref 43–77)
Platelets: 225 10*3/uL (ref 150–400)
RBC: 4.81 MIL/uL (ref 4.22–5.81)
RDW: 14.9 % (ref 11.5–15.5)
WBC: 5.4 10*3/uL (ref 4.0–10.5)

## 2015-07-11 LAB — LIPID PANEL
Cholesterol: 141 mg/dL (ref 125–200)
HDL: 34 mg/dL — ABNORMAL LOW (ref >=40)
LDL CALC: 84 mg/dL (ref <130)
TRIGLYCERIDES: 115 mg/dL (ref <150)
Total CHOL/HDL Ratio: 4.1 Ratio (ref <=5.0)
VLDL: 23 mg/dL (ref <30)

## 2015-07-11 LAB — HEPATITIS C ANTIBODY: HCV AB: NEGATIVE

## 2015-07-12 LAB — MICROALBUMIN / CREATININE URINE RATIO
CREATININE, URINE: 207.2 mg/dL
Microalb Creat Ratio: 27.5 mg/g (ref 0.0–30.0)
Microalb, Ur: 5.7 mg/dL — ABNORMAL HIGH (ref <2.0)

## 2015-07-12 LAB — T-HELPER CELL (CD4) - (RCID CLINIC ONLY)
CD4 T CELL ABS: 280 /uL — AB (ref 400–2700)
CD4 T CELL HELPER: 20 % — AB (ref 33–55)

## 2015-07-12 LAB — URINE CYTOLOGY ANCILLARY ONLY
Chlamydia: NEGATIVE
Neisseria Gonorrhea: NEGATIVE

## 2015-07-12 LAB — RPR

## 2015-07-13 LAB — HIV-1 RNA QUANT-NO REFLEX-BLD: HIV-1 RNA Quant, Log: <1.3 {Log} (ref <1.30)

## 2015-07-24 ENCOUNTER — Encounter: Payer: Self-pay | Admitting: Infectious Disease

## 2015-07-24 ENCOUNTER — Ambulatory Visit (INDEPENDENT_AMBULATORY_CARE_PROVIDER_SITE_OTHER): Payer: Managed Care, Other (non HMO) | Admitting: Infectious Disease

## 2015-07-24 VITALS — BP 131/76 | HR 67 | Temp 98.1°F | Wt 205.0 lb

## 2015-07-24 DIAGNOSIS — K832 Perforation of bile duct: Secondary | ICD-10-CM | POA: Diagnosis not present

## 2015-07-24 DIAGNOSIS — N528 Other male erectile dysfunction: Secondary | ICD-10-CM

## 2015-07-24 DIAGNOSIS — K831 Obstruction of bile duct: Secondary | ICD-10-CM

## 2015-07-24 DIAGNOSIS — B20 Human immunodeficiency virus [HIV] disease: Secondary | ICD-10-CM | POA: Diagnosis not present

## 2015-07-24 DIAGNOSIS — I10 Essential (primary) hypertension: Secondary | ICD-10-CM

## 2015-07-24 MED ORDER — ABACAVIR-DOLUTEGRAVIR-LAMIVUD 600-50-300 MG PO TABS: 1.0000 | ORAL_TABLET | Freq: Every day | ORAL | Status: DC

## 2015-07-24 NOTE — Progress Notes (Signed)
Subjective:    Patient ID: Jay Hoffman, male    DOB: 1946/11/08, 69 y.o.   MRN: [id number]  HPI   Jay Hoffman is a 69 y.o. male with HIV/AIDS, who is doing perfectly on his current regimen of Atripla.   Lab Results  Component Value Date   HIV1RNAQUANT <20 07/11/2015   Lab Results  Component Value Date   CD4TABS 280* 07/11/2015   CD4TABS 350* 12/13/2014   CD4TABS 480 05/02/2014     He has had several new vaccines (updating in chart) and colonoscopy brought with him.\  We discussed changing him from his current Atripla to aid of another single tablet regimen that would be safer on bones and kidney and ultimately decided to change him to [facility].   Review of Systems  Constitutional: Negative for fever, chills, diaphoresis, activity change, appetite change, fatigue and unexpected weight change.  HENT: Negative for congestion, rhinorrhea, sinus pressure, sneezing, sore throat and trouble swallowing.   Eyes: Negative for photophobia and visual disturbance.  Respiratory: Negative for cough, chest tightness, shortness of breath, wheezing and stridor.   Cardiovascular: Negative for chest pain, palpitations and leg swelling.  Gastrointestinal: Negative for nausea, vomiting, abdominal pain, diarrhea, constipation, blood in stool, abdominal distention and anal bleeding.  Genitourinary: Negative for dysuria, hematuria, flank pain and difficulty urinating.  Musculoskeletal: Negative for myalgias, back pain, joint swelling, arthralgias and gait problem.  Skin: Negative for color change, pallor, rash and wound.  Neurological: Negative for dizziness, tremors, weakness and light-headedness.  Hematological: Negative for adenopathy. Does not bruise/bleed easily.  Psychiatric/Behavioral: Negative for behavioral problems, confusion, sleep disturbance, dysphoric mood, decreased concentration and agitation.       Objective:   Physical Exam  Constitutional: He is oriented to person,  place, and time. He appears well-developed and well-nourished. No distress.  HENT:  Head: Normocephalic and atraumatic.  Mouth/Throat: Oropharynx is clear and moist. No oropharyngeal exudate.  Eyes: Conjunctivae and EOM are normal. Pupils are equal, round, and reactive to light. No scleral icterus.  Neck: Normal range of motion. Neck supple. No JVD present.  Cardiovascular: Normal rate, regular rhythm and normal heart sounds.  Exam reveals no gallop and no friction rub.   No murmur heard. Pulmonary/Chest: Effort normal and breath sounds normal. No respiratory distress. He has no wheezes. He has no rales. He exhibits no tenderness.  Abdominal: He exhibits no distension and no mass. There is no tenderness. There is no rebound and no guarding.  Musculoskeletal: He exhibits no edema or tenderness.  Lymphadenopathy:    He has no cervical adenopathy.  Neurological: He is alert and oriented to person, place, and time. He has normal reflexes. He exhibits normal muscle tone. Coordination normal.  Skin: Skin is warm and dry. He is not diaphoretic. No erythema. No pallor.  Psychiatric: He has a normal mood and affect. His behavior is normal. Judgment and thought content normal.          Assessment & Plan:  HIV:.change to [place] and bring back to clinic in 1-2 months time to recheck viral load and CD4 count.    BILE DUCT STRICTURE -  Stents out, followed by Dr. Henrene Pastor. q 6 months. LFTs are stable.  Hepatic Function Latest Ref Rng 07/11/2015 06/15/2015 12/13/2014  Total Protein 6.1 - 8.1 g/dL 7.7 8.1 7.3  Albumin 3.6 - 5.1 g/dL 4.1 4.1 3.8  AST 10 - 35 U/L 19 20 19   ALT 9 - 46 U/L 15 16 14   Alk Phosphatase  40 - 115 U/L 98 110 102  Total Bilirubin 0.2 - 1.2 mg/dL 0.3 0.3 0.3  Bilirubin, Direct 0.0 - 0.3 mg/dL - 0.1 -      ED: viagra rx for him  Hyperlipidemia: On pravastatin at 80 mg dose.   BPH: on finasteride   Send labs and note to Dr. Brigitte Pulse and Dr. Henrene Pastor  I spent greater than 40  minutes with the patient including greater than 50% of time in face to face counsel of the patient regarding his HIV is new and ARV regimen his hyperlipidemia BPH and erectile dysfunction and in coordination of his care.

## 2015-07-25 ENCOUNTER — Telehealth: Payer: Self-pay | Admitting: Licensed Clinical Social Worker

## 2015-07-25 NOTE — Telephone Encounter (Signed)
I guess he MUST change back. Lousy insurance

## 2015-07-25 NOTE — Telephone Encounter (Signed)
Patient can not use copay card for Triumeq because he has Medicare, and he has a supplement which is Monia Pouch that pays a portion as well. Triumeq is more expensive than what he was paying and he wants to go back to Atripla. Patient does not qualify for spap services, or PAN foundation because he makes too much money. It will cost him 1800.00 a month for Triumeq. Patient makes over 60,000 a year and does not qualify for any programs. Can I change back to Atripla?

## 2015-07-26 ENCOUNTER — Other Ambulatory Visit: Payer: Self-pay | Admitting: Licensed Clinical Social Worker

## 2015-07-26 MED ORDER — EFAVIRENZ-EMTRICITAB-TENOFOVIR 600-200-300 MG PO TABS: 1.0000 | ORAL_TABLET | Freq: Every day | ORAL | Status: DC

## 2015-09-13 ENCOUNTER — Other Ambulatory Visit: Payer: Managed Care, Other (non HMO)

## 2015-09-27 ENCOUNTER — Ambulatory Visit: Payer: Managed Care, Other (non HMO) | Admitting: Infectious Disease

## 2015-11-01 ENCOUNTER — Other Ambulatory Visit: Payer: Self-pay | Admitting: *Deleted

## 2015-11-01 DIAGNOSIS — B20 Human immunodeficiency virus [HIV] disease: Secondary | ICD-10-CM

## 2015-11-01 MED ORDER — EFAVIRENZ-EMTRICITAB-TENOFOVIR 600-200-300 MG PO TABS: 1.0000 | ORAL_TABLET | Freq: Every day | ORAL | Status: DC

## 2015-11-02 ENCOUNTER — Telehealth: Payer: Self-pay | Admitting: *Deleted

## 2015-11-02 NOTE — Telephone Encounter (Signed)
Called patient and left a voice mail to inform him that his Rx was ready for pick up. Wendall MolaJacqueline Cockerham

## 2015-12-12 ENCOUNTER — Telehealth: Payer: Self-pay

## 2015-12-12 NOTE — Telephone Encounter (Signed)
-----   Message from Chrystie NoseLinda R Hunt, RN sent at 06/16/2015  2:52 PM EDT ----- Regarding: LFTs Pt needs lfts in January

## 2015-12-12 NOTE — Telephone Encounter (Signed)
Pt aware.

## 2015-12-14 ENCOUNTER — Other Ambulatory Visit (INDEPENDENT_AMBULATORY_CARE_PROVIDER_SITE_OTHER): Payer: Managed Care, Other (non HMO)

## 2015-12-14 ENCOUNTER — Other Ambulatory Visit: Payer: Self-pay

## 2015-12-14 DIAGNOSIS — R945 Abnormal results of liver function studies: Principal | ICD-10-CM

## 2015-12-14 DIAGNOSIS — R7989 Other specified abnormal findings of blood chemistry: Secondary | ICD-10-CM

## 2015-12-14 LAB — HEPATIC FUNCTION PANEL
ALBUMIN: 4.1 g/dL (ref 3.5–5.2)
ALK PHOS: 110 U/L (ref 39–117)
ALT: 14 U/L (ref 0–53)
AST: 18 U/L (ref 0–37)
BILIRUBIN DIRECT: 0.1 mg/dL (ref 0.0–0.3)
Total Bilirubin: 0.3 mg/dL (ref 0.2–1.2)
Total Protein: 7.9 g/dL (ref 6.0–8.3)

## 2016-01-03 ENCOUNTER — Other Ambulatory Visit (HOSPITAL_COMMUNITY)
Admission: RE | Admit: 2016-01-03 | Discharge: 2016-01-03 | Disposition: A | Discharge status: Home / Self Care | Payer: Medicare HMO | Source: Ambulatory Visit | Attending: Infectious Diseases | Admitting: Infectious Diseases

## 2016-01-03 ENCOUNTER — Other Ambulatory Visit: Payer: Medicare HMO

## 2016-01-03 DIAGNOSIS — Z113 Encounter for screening for infections with a predominantly sexual mode of transmission: Secondary | ICD-10-CM | POA: Diagnosis present

## 2016-01-03 DIAGNOSIS — B20 Human immunodeficiency virus [HIV] disease: Secondary | ICD-10-CM

## 2016-01-03 LAB — CBC WITH DIFFERENTIAL/PLATELET
BASOS ABS: 0 10*3/uL (ref 0.0–0.1)
Basophils Relative: 0 % (ref 0–1)
EOS ABS: 0.1 10*3/uL (ref 0.0–0.7)
EOS PCT: 1 % (ref 0–5)
HCT: 41.7 % (ref 39.0–52.0)
Hemoglobin: 14.2 g/dL (ref 13.0–17.0)
LYMPHS PCT: 29 % (ref 12–46)
Lymphs Abs: 1.7 10*3/uL (ref 0.7–4.0)
MCH: 29.6 pg (ref 26.0–34.0)
MCHC: 34.1 g/dL (ref 30.0–36.0)
MCV: 86.9 fL (ref 78.0–100.0)
MPV: 8.7 fL (ref 8.6–12.4)
Monocytes Absolute: 0.4 10*3/uL (ref 0.1–1.0)
Monocytes Relative: 7 % (ref 3–12)
Neutro Abs: 3.7 10*3/uL (ref 1.7–7.7)
Neutrophils Relative %: 63 % (ref 43–77)
PLATELETS: 217 10*3/uL (ref 150–400)
RBC: 4.8 MIL/uL (ref 4.22–5.81)
RDW: 14.6 % (ref 11.5–15.5)
WBC: 5.9 10*3/uL (ref 4.0–10.5)

## 2016-01-03 LAB — COMPLETE METABOLIC PANEL WITH GFR
ALT: 12 U/L (ref 9–46)
AST: 17 U/L (ref 10–35)
Albumin: 4.2 g/dL (ref 3.6–5.1)
Alkaline Phosphatase: 93 U/L (ref 40–115)
BILIRUBIN TOTAL: 0.3 mg/dL (ref 0.2–1.2)
BUN: 18 mg/dL (ref 7–25)
CO2: 21 mmol/L (ref 20–31)
Calcium: 9.3 mg/dL (ref 8.6–10.3)
Chloride: 108 mmol/L (ref 98–110)
Creat: 1.3 mg/dL — ABNORMAL HIGH (ref 0.70–1.25)
GFR, EST NON AFRICAN AMERICAN: 56 mL/min — AB (ref >=60)
GFR, Est African American: 64 mL/min (ref >=60)
GLUCOSE: 102 mg/dL — AB (ref 65–99)
Potassium: 4.5 mmol/L (ref 3.5–5.3)
Sodium: 140 mmol/L (ref 135–146)
Total Protein: 7.9 g/dL (ref 6.1–8.1)

## 2016-01-03 NOTE — Addendum Note (Signed)
Addended by: Mariea Clonts D on: 01/03/2016 03:25 PM   Modules accepted: Orders

## 2016-01-04 LAB — URINE CYTOLOGY ANCILLARY ONLY
CHLAMYDIA, DNA PROBE: NEGATIVE
Neisseria Gonorrhea: NEGATIVE

## 2016-01-04 LAB — HIV-1 RNA QUANT-NO REFLEX-BLD
HIV 1 RNA QUANT: 26 {copies}/mL — AB (ref <20)
HIV-1 RNA Quant, Log: 1.41 Log copies/mL — ABNORMAL HIGH (ref <1.30)

## 2016-01-04 LAB — T-HELPER CELL (CD4) - (RCID CLINIC ONLY)
CD4 T CELL HELPER: 20 % — AB (ref 33–55)
CD4 T Cell Abs: 370 /uL — ABNORMAL LOW (ref 400–2700)

## 2016-01-08 ENCOUNTER — Other Ambulatory Visit: Payer: Managed Care, Other (non HMO)

## 2016-01-17 ENCOUNTER — Ambulatory Visit: Payer: Managed Care, Other (non HMO) | Admitting: Infectious Disease

## 2016-02-21 ENCOUNTER — Encounter: Payer: Self-pay | Admitting: Infectious Disease

## 2016-02-21 ENCOUNTER — Ambulatory Visit (INDEPENDENT_AMBULATORY_CARE_PROVIDER_SITE_OTHER): Payer: Medicare HMO | Admitting: Infectious Disease

## 2016-02-21 VITALS — BP 134/79 | HR 76 | Temp 98.8°F | Wt 212.0 lb

## 2016-02-21 DIAGNOSIS — K831 Obstruction of bile duct: Secondary | ICD-10-CM | POA: Diagnosis not present

## 2016-02-21 DIAGNOSIS — B20 Human immunodeficiency virus [HIV] disease: Secondary | ICD-10-CM

## 2016-02-21 DIAGNOSIS — I10 Essential (primary) hypertension: Secondary | ICD-10-CM | POA: Diagnosis not present

## 2016-02-21 MED ORDER — EFAVIRENZ-EMTRICITAB-TENOFOVIR 600-200-300 MG PO TABS: 1.0000 | ORAL_TABLET | Freq: Every day | ORAL | Status: DC

## 2016-02-21 NOTE — Assessment & Plan Note (Addendum)
Assessment: Patient with HIV originally diagnosed back in 2005 or 2006 who continues to have good control and maintain virologic suppression of his disease on Atripla.  He reports taking it in the evening time.  Most recent CD4 and VL from February 2017 of 370 and 26.  Denies any medication side effect, difficulty with access to medications, or new partners.  Kidney function is stable.  Plan: - continue on Atripla - check urine for GC/CT - RTC in 1 year

## 2016-02-21 NOTE — Progress Notes (Signed)
Patient Active Problem List   Diagnosis Date Noted  . AIDS (HCC) 12/26/2014  . Erectile dysfunction 05/13/2011  . FATIGUE 11/19/2010  . 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 Essential hypertension 08/08/2010  . PERFORATION OF BILE DUCT 07/30/2010  . ACUTE CHOLECYSTITIS 06/27/2010  . ABDOMINAL PAIN RIGHT UPPER QUADRANT 06/21/2010  . FEVER, HX OF 06/21/2010  . HYPERLIPIDEMIA, WITH LOW HDL 11/09/2007  . BPH (benign prostatic hyperplasia) 09/21/2007  . Human immunodeficiency virus (HIV) disease (HCC) 12/10/2006  . 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     PRAVASTATIN (PRAVACHOL) 80 MG TABLET       SILDENAFIL (VIAGRA) 50 MG TABLET    Take one half to one whole one half hour before sexual activity  Modified Medications   Modified Medication Previous Medication   EFAVIRENZ-EMTRICITABINE-TENOFOVIR (ATRIPLA) 600-200-300 MG TABLET efavirenz-emtricitabine-tenofovir (ATRIPLA) 600-200-300 MG tablet      Take 1 tablet by mouth at bedtime.    Take 1 tablet by mouth at bedtime.  Discontinued Medications   No medications on file    Subjective: Jay Hoffman is doing well on Atripla. At last visit, it had been discussed changing to Triumeq but he reports that this was going to be too expensive with his current insurance coverage.  He continues to take his medications daily with no missed doses.  He has not had any difficulty with access to medications.  No new partners.   Review of Systems: Review of Systems  Constitutional: Negative for fever and chills.  HENT: Negative for sore throat.   Respiratory: Negative for cough and shortness of breath.   Cardiovascular: Negative for chest pain.  Gastrointestinal: Negative for nausea, vomiting and diarrhea.  Musculoskeletal: Negative for myalgias.  Skin:  Negative for rash.  Neurological: Negative for headaches.  Psychiatric/Behavioral: Negative for depression.    Past Medical History  Diagnosis Date  . Acute cholecystitis   . Unspecified personal history presenting hazards to health   . Other and unspecified hyperlipidemia   . Problems related to high-risk sexual behavior   . BPH (benign prostatic hyperplasia)   . Cryptococcosis (HCC)   . Pneumocystosis (HCC)   . Human immunodeficiency virus (HIV) disease (HCC)     Social History  Substance Use Topics  . Smoking status: Never Smoker   . Smokeless tobacco: Never Used  . Alcohol Use: No     Comment: occasional    Family History  Problem Relation Age of Onset  . Colon cancer Neg Hx   . Esophageal cancer Father   . Diabetes Paternal Aunt   . Kidney disease Sister     No Known Allergies  Objective:  Filed Vitals:   02/21/16 1012  BP: 134/79  Pulse: 76  Temp: 98.8 F (37.1 C)  TempSrc: Oral  Weight: 212 lb (96.163 kg)   Body mass index is 31.29 kg/(m^2).  Physical Exam  Constitutional: He is oriented to person, place, and time and well-developed, well-nourished, and in no distress.  HENT:  Head: Normocephalic and atraumatic.  Eyes: Conjunctivae and EOM are normal.  Neck: Normal range of motion.  Cardiovascular: Normal rate and regular rhythm.   Pulmonary/Chest: Effort normal and breath sounds normal.  Abdominal: Soft. There is no tenderness.  Neurological: He is alert and oriented  to person, place, and time.  Skin: Skin is warm and dry. No rash noted.    Lab Results Lab Results  Component Value Date   WBC 5.9 01/03/2016   HGB 14.2 01/03/2016   HCT 41.7 01/03/2016   MCV 86.9 01/03/2016   PLT 217 01/03/2016    Lab Results  Component Value Date   CREATININE 1.30* 01/03/2016   BUN 18 01/03/2016   NA 140 01/03/2016   K 4.5 01/03/2016   CL 108 01/03/2016   CO2 21 01/03/2016    Lab Results  Component Value Date   ALT 12 01/03/2016   AST 17  01/03/2016   ALKPHOS 93 01/03/2016   BILITOT 0.3 01/03/2016    Lab Results  Component Value Date   CHOL 141 07/11/2015   HDL 34* 07/11/2015   LDLCALC 84 07/11/2015   TRIG 115 07/11/2015   CHOLHDL 4.1 07/11/2015    Lab Results HIV 1 RNA QUANT (copies/mL)  Date Value  01/03/2016 26*  07/11/2015 <20  12/13/2014 <20   CD4 T CELL ABS (/uL)  Date Value  01/03/2016 370*  07/11/2015 280*  12/13/2014 350*      Problem List Items Addressed This Visit      Other   Human immunodeficiency virus (HIV) disease (HCC)    Assessment: Patient with HIV originally diagnosed back in 2005 or 2006 who continues to have good control and maintain virologic suppression of his disease on Atripla.  He reports taking it in the evening time.  Most recent CD4 and VL from February 2017 of 370 and 26.  Denies any medication side effect, difficulty with access to medications, or new partners.  Kidney function is stable.  Plan: - continue on Atripla - check urine for GC/CT - RTC in 1 year      Relevant Medications   efavirenz-emtricitabine-tenofovir (ATRIPLA) 600-200-300 MG tablet   Other Relevant Orders   HIV 1 RNA quant-no reflex-bld   T-helper cell (CD4)- ([facility] clinic only)   CBC with Differential/Platelet   COMPLETE METABOLIC PANEL WITH GFR   Urine cytology ancillary only   RPR   Lipid panel   Microalbumin / creatinine urine ratio    Other Visit Diagnoses    Human immunodeficiency virus (HIV) disease (HCC)    -  Primary    Relevant Medications    efavirenz-emtricitabine-tenofovir (ATRIPLA) 600-200-300 MG tablet         Jay Burly, DO   02/21/2016, 11:03 AM

## 2016-06-10 ENCOUNTER — Telehealth: Payer: Self-pay

## 2016-06-10 NOTE — Telephone Encounter (Signed)
-----   Message from Chrystie NoseLinda R Anthone Prieur, RN sent at 12/14/2015  2:44 PM EST ----- Regarding: Labs Pt needs labs in July, order in epic.

## 2016-06-10 NOTE — Telephone Encounter (Signed)
Pt aware.

## 2016-06-12 ENCOUNTER — Other Ambulatory Visit: Payer: Self-pay

## 2016-06-12 ENCOUNTER — Other Ambulatory Visit (INDEPENDENT_AMBULATORY_CARE_PROVIDER_SITE_OTHER): Payer: Medicare HMO

## 2016-06-12 DIAGNOSIS — R7989 Other specified abnormal findings of blood chemistry: Secondary | ICD-10-CM

## 2016-06-12 DIAGNOSIS — R945 Abnormal results of liver function studies: Principal | ICD-10-CM

## 2016-06-12 LAB — HEPATIC FUNCTION PANEL
ALT: 13 U/L (ref 0–53)
AST: 16 U/L (ref 0–37)
Albumin: 4.2 g/dL (ref 3.5–5.2)
Alkaline Phosphatase: 102 U/L (ref 39–117)
BILIRUBIN TOTAL: 0.2 mg/dL (ref 0.2–1.2)
Bilirubin, Direct: 0.1 mg/dL (ref 0.0–0.3)
Total Protein: 8.1 g/dL (ref 6.0–8.3)

## 2016-09-27 DIAGNOSIS — N400 Enlarged prostate without lower urinary tract symptoms: Secondary | ICD-10-CM | POA: Diagnosis not present

## 2016-09-27 DIAGNOSIS — Z1211 Encounter for screening for malignant neoplasm of colon: Secondary | ICD-10-CM | POA: Diagnosis not present

## 2016-09-27 DIAGNOSIS — E669 Obesity, unspecified: Secondary | ICD-10-CM | POA: Diagnosis not present

## 2016-09-27 DIAGNOSIS — R69 Illness, unspecified: Secondary | ICD-10-CM | POA: Diagnosis not present

## 2016-09-27 DIAGNOSIS — Z0000 Encounter for general adult medical examination without abnormal findings: Secondary | ICD-10-CM | POA: Diagnosis not present

## 2016-09-27 DIAGNOSIS — Z23 Encounter for immunization: Secondary | ICD-10-CM | POA: Diagnosis not present

## 2016-09-27 DIAGNOSIS — H8113 Benign paroxysmal vertigo, bilateral: Secondary | ICD-10-CM | POA: Diagnosis not present

## 2016-09-27 DIAGNOSIS — Z125 Encounter for screening for malignant neoplasm of prostate: Secondary | ICD-10-CM | POA: Diagnosis not present

## 2016-09-27 DIAGNOSIS — N183 Chronic kidney disease, stage 3 (moderate): Secondary | ICD-10-CM | POA: Diagnosis not present

## 2016-09-27 DIAGNOSIS — E782 Mixed hyperlipidemia: Secondary | ICD-10-CM | POA: Diagnosis not present

## 2016-10-08 DIAGNOSIS — Z1211 Encounter for screening for malignant neoplasm of colon: Secondary | ICD-10-CM | POA: Diagnosis not present

## 2016-10-16 ENCOUNTER — Other Ambulatory Visit: Payer: Self-pay | Admitting: *Deleted

## 2016-10-16 DIAGNOSIS — B20 Human immunodeficiency virus [HIV] disease: Secondary | ICD-10-CM

## 2016-10-16 MED ORDER — EFAVIRENZ-EMTRICITAB-TENOFOVIR 600-200-300 MG PO TABS: 1.0000 | ORAL_TABLET | Freq: Every day | ORAL | 3 refills | Status: DC

## 2016-12-09 DIAGNOSIS — R972 Elevated prostate specific antigen [PSA]: Secondary | ICD-10-CM | POA: Diagnosis not present

## 2016-12-09 DIAGNOSIS — N400 Enlarged prostate without lower urinary tract symptoms: Secondary | ICD-10-CM | POA: Diagnosis not present

## 2016-12-17 ENCOUNTER — Other Ambulatory Visit (INDEPENDENT_AMBULATORY_CARE_PROVIDER_SITE_OTHER): Payer: Medicare HMO

## 2016-12-17 DIAGNOSIS — R945 Abnormal results of liver function studies: Principal | ICD-10-CM

## 2016-12-17 DIAGNOSIS — R7989 Other specified abnormal findings of blood chemistry: Secondary | ICD-10-CM | POA: Diagnosis not present

## 2016-12-17 LAB — HEPATIC FUNCTION PANEL
ALT: 16 U/L (ref 0–53)
AST: 19 U/L (ref 0–37)
Albumin: 4.1 g/dL (ref 3.5–5.2)
Alkaline Phosphatase: 95 U/L (ref 39–117)
Bilirubin, Direct: 0 mg/dL (ref 0.0–0.3)
Total Bilirubin: 0.3 mg/dL (ref 0.2–1.2)
Total Protein: 8.1 g/dL (ref 6.0–8.3)

## 2016-12-23 ENCOUNTER — Other Ambulatory Visit: Payer: Self-pay

## 2016-12-23 ENCOUNTER — Other Ambulatory Visit: Payer: Medicare HMO

## 2016-12-23 DIAGNOSIS — R7989 Other specified abnormal findings of blood chemistry: Secondary | ICD-10-CM

## 2016-12-23 DIAGNOSIS — R945 Abnormal results of liver function studies: Principal | ICD-10-CM

## 2016-12-27 ENCOUNTER — Other Ambulatory Visit: Payer: Self-pay

## 2016-12-31 ENCOUNTER — Other Ambulatory Visit: Payer: Self-pay

## 2016-12-31 DIAGNOSIS — R7989 Other specified abnormal findings of blood chemistry: Secondary | ICD-10-CM

## 2016-12-31 DIAGNOSIS — R945 Abnormal results of liver function studies: Principal | ICD-10-CM

## 2017-01-06 ENCOUNTER — Ambulatory Visit: Payer: Medicare HMO | Admitting: Infectious Disease

## 2017-01-16 ENCOUNTER — Other Ambulatory Visit: Payer: Medicare HMO

## 2017-01-16 ENCOUNTER — Other Ambulatory Visit (HOSPITAL_COMMUNITY)
Admission: RE | Admit: 2017-01-16 | Discharge: 2017-01-16 | Disposition: A | Discharge status: Home / Self Care | Payer: Medicare HMO | Source: Ambulatory Visit | Attending: Infectious Disease | Admitting: Infectious Disease

## 2017-01-16 DIAGNOSIS — R69 Illness, unspecified: Secondary | ICD-10-CM | POA: Diagnosis not present

## 2017-01-16 DIAGNOSIS — B20 Human immunodeficiency virus [HIV] disease: Secondary | ICD-10-CM | POA: Diagnosis not present

## 2017-01-16 DIAGNOSIS — Z113 Encounter for screening for infections with a predominantly sexual mode of transmission: Secondary | ICD-10-CM | POA: Diagnosis present

## 2017-01-16 DIAGNOSIS — Z79899 Other long term (current) drug therapy: Secondary | ICD-10-CM

## 2017-01-16 LAB — COMPLETE METABOLIC PANEL WITH GFR
ALBUMIN: 4 g/dL (ref 3.6–5.1)
ALK PHOS: 95 U/L (ref 40–115)
ALT: 16 U/L (ref 9–46)
AST: 18 U/L (ref 10–35)
BUN: 16 mg/dL (ref 7–25)
CO2: 22 mmol/L (ref 20–31)
Calcium: 9.2 mg/dL (ref 8.6–10.3)
Chloride: 107 mmol/L (ref 98–110)
Creat: 1.29 mg/dL — ABNORMAL HIGH (ref 0.70–1.18)
GFR, EST NON AFRICAN AMERICAN: 56 mL/min — AB (ref >=60)
GFR, Est African American: 65 mL/min (ref >=60)
GLUCOSE: 98 mg/dL (ref 65–99)
POTASSIUM: 4.4 mmol/L (ref 3.5–5.3)
SODIUM: 138 mmol/L (ref 135–146)
Total Bilirubin: 0.3 mg/dL (ref 0.2–1.2)
Total Protein: 7.8 g/dL (ref 6.1–8.1)

## 2017-01-16 LAB — CBC WITH DIFFERENTIAL/PLATELET
BASOS ABS: 0 {cells}/uL (ref 0–200)
Basophils Relative: 0 %
EOS PCT: 1 %
Eosinophils Absolute: 56 cells/uL (ref 15–500)
HCT: 43.1 % (ref 38.5–50.0)
Hemoglobin: 14.6 g/dL (ref 13.2–17.1)
Lymphocytes Relative: 36 %
Lymphs Abs: 2016 cells/uL (ref 850–3900)
MCH: 29.5 pg (ref 27.0–33.0)
MCHC: 33.9 g/dL (ref 32.0–36.0)
MCV: 87.1 fL (ref 80.0–100.0)
MONOS PCT: 7 %
MPV: 9.1 fL (ref 7.5–12.5)
Monocytes Absolute: 392 cells/uL (ref 200–950)
NEUTROS PCT: 56 %
Neutro Abs: 3136 cells/uL (ref 1500–7800)
PLATELETS: 232 10*3/uL (ref 140–400)
RBC: 4.95 MIL/uL (ref 4.20–5.80)
RDW: 14.9 % (ref 11.0–15.0)
WBC: 5.6 10*3/uL (ref 3.8–10.8)

## 2017-01-16 LAB — LIPID PANEL
CHOLESTEROL: 163 mg/dL (ref <200)
HDL: 36 mg/dL — ABNORMAL LOW (ref >40)
LDL Cholesterol: 99 mg/dL (ref <100)
Total CHOL/HDL Ratio: 4.5 Ratio (ref <5.0)
Triglycerides: 142 mg/dL (ref <150)
VLDL: 28 mg/dL (ref <30)

## 2017-01-17 LAB — MICROALBUMIN / CREATININE URINE RATIO
Creatinine, Urine: 220 mg/dL (ref 20–370)
Microalb Creat Ratio: 19 mcg/mg creat (ref <30)
Microalb, Ur: 4.1 mg/dL

## 2017-01-17 LAB — URINE CYTOLOGY ANCILLARY ONLY
CHLAMYDIA, DNA PROBE: NEGATIVE
NEISSERIA GONORRHEA: NEGATIVE

## 2017-01-17 LAB — RPR

## 2017-01-17 LAB — T-HELPER CELL (CD4) - (RCID CLINIC ONLY)
CD4 % Helper T Cell: 21 % — ABNORMAL LOW (ref 33–55)
CD4 T Cell Abs: 460 /uL (ref 400–2700)

## 2017-01-23 LAB — HIV-1 RNA QUANT-NO REFLEX-BLD
HIV 1 RNA QUANT: 39 {copies}/mL — AB
HIV-1 RNA QUANT, LOG: 1.59 {Log_copies}/mL — AB

## 2017-01-29 ENCOUNTER — Encounter: Payer: Self-pay | Admitting: Infectious Disease

## 2017-01-29 ENCOUNTER — Ambulatory Visit (INDEPENDENT_AMBULATORY_CARE_PROVIDER_SITE_OTHER): Payer: Medicare HMO | Admitting: Infectious Disease

## 2017-01-29 VITALS — BP 132/81 | HR 79 | Temp 98.2°F | Ht 68.0 in | Wt 208.0 lb

## 2017-01-29 DIAGNOSIS — K831 Obstruction of bile duct: Secondary | ICD-10-CM

## 2017-01-29 DIAGNOSIS — B20 Human immunodeficiency virus [HIV] disease: Secondary | ICD-10-CM

## 2017-01-29 DIAGNOSIS — I10 Essential (primary) hypertension: Secondary | ICD-10-CM

## 2017-01-29 DIAGNOSIS — R69 Illness, unspecified: Secondary | ICD-10-CM | POA: Diagnosis not present

## 2017-01-29 NOTE — Progress Notes (Signed)
Subjective:    Patient ID: Jay Hoffman, male    DOB: [birth year], 71 y.o.   MRN: [id number]  HPI  Jay Hoffman is a 71 y.o. male with HIV/AIDS, who is doing perfectly on his current regimen of Atripla. I had tried to change him to [facility] but he found the copay was exorbitant with his drug plan. He does not qualify for [name].  Lab Results  Component Value Date   HIV1RNAQUANT 39 (H) 01/16/2017   HIV1RNAQUANT 26 (H) 01/03/2016   HIV1RNAQUANT <20 07/11/2015      Lab Results  Component Value Date   CD4TABS 460 01/16/2017   CD4TABS 370 (L) 01/03/2016   CD4TABS 280 (L) 07/11/2015     Past Medical History:  Diagnosis Date  . Acute cholecystitis   . BPH (benign prostatic hyperplasia)   . Cryptococcosis ([place])   . Human immunodeficiency virus (HIV) disease   . Other and unspecified hyperlipidemia   . Pneumocystosis ([place])   . Problems related to high-risk sexual behavior   . Unspecified personal history presenting hazards to health     Past Surgical History:  Procedure Laterality Date  . CHOLECYSTECTOMY    . ERCP      Family History  Problem Relation Age of Onset  . Colon cancer Neg Hx   . Esophageal cancer Father   . Diabetes Paternal Aunt   . Kidney disease Sister       Social History   Social History  . Marital status: Married    Spouse name: N/A  . Number of children: 2  . Years of education: N/A   Occupational History  . retired Airline pilot    Social History Main Topics  . Smoking status: Never Smoker  . Smokeless tobacco: Never Used  . Alcohol use No     Comment: occasional  . Drug use: No  . Sexual activity: Yes    Partners: Female     Comment: pt. given condoms   Other Topics Concern  . None   Social History Narrative   Daily caffeine    No Known Allergies   Current Outpatient Prescriptions:  .  efavirenz-emtricitabine-tenofovir (ATRIPLA) 600-200-300 MG tablet, Take 1 tablet by mouth at bedtime., Disp: 90 tablet, Rfl: 3 .   finasteride (PROSCAR) 5 MG tablet, Take 5 mg by mouth daily.  , Disp: , Rfl:  .  pravastatin (PRAVACHOL) 80 MG tablet, , Disp: , Rfl: 5 .  sildenafil (VIAGRA) 50 MG tablet, Take one half to one whole one half hour before sexual activity, Disp: 20 tablet, Rfl: 4   Review of Systems  Constitutional: Negative for activity change, appetite change, chills, diaphoresis, fatigue, fever and unexpected weight change.  HENT: Negative for congestion, rhinorrhea, sinus pressure, sneezing, sore throat and trouble swallowing.   Eyes: Negative for photophobia and visual disturbance.  Respiratory: Negative for cough, chest tightness, shortness of breath, wheezing and stridor.   Cardiovascular: Negative for chest pain, palpitations and leg swelling.  Gastrointestinal: Negative for abdominal distention, abdominal pain, anal bleeding, blood in stool, constipation, diarrhea, nausea and vomiting.  Genitourinary: Negative for difficulty urinating, dysuria, flank pain and hematuria.  Musculoskeletal: Negative for arthralgias, back pain, gait problem, joint swelling and myalgias.  Skin: Negative for color change, pallor, rash and wound.  Neurological: Negative for dizziness, tremors, weakness and light-headedness.  Hematological: Negative for adenopathy. Does not bruise/bleed easily.  Psychiatric/Behavioral: Negative for agitation, behavioral problems, confusion, decreased concentration, dysphoric mood and sleep disturbance.  Objective:   Physical Exam  Constitutional: He is oriented to person, place, and time. He appears well-developed and well-nourished. No distress.  HENT:  Head: Normocephalic and atraumatic.  Mouth/Throat: Oropharynx is clear and moist. No oropharyngeal exudate.  Eyes: Conjunctivae and EOM are normal. Pupils are equal, round, and reactive to light. No scleral icterus.  Neck: Normal range of motion. Neck supple. No JVD present.  Cardiovascular: Normal rate, regular rhythm and normal  heart sounds.  Exam reveals no gallop and no friction rub.   No murmur heard. Pulmonary/Chest: Effort normal and breath sounds normal. No respiratory distress. He has no wheezes. He has no rales. He exhibits no tenderness.  Abdominal: He exhibits no distension and no mass. There is no tenderness. There is no rebound and no guarding.  Musculoskeletal: He exhibits no edema or tenderness.  Lymphadenopathy:    He has no cervical adenopathy.  Neurological: He is alert and oriented to person, place, and time. He has normal reflexes. He exhibits normal muscle tone. Coordination normal.  Skin: Skin is warm and dry. He is not diaphoretic. No erythema. No pallor.  Psychiatric: He has a normal mood and affect. His behavior is normal. Judgment and thought content normal.          Assessment & Plan:  HIV:.continue Atripla. Will see if there is anything we can do to help  With cost standpoint to get him on a more modern bone and kidney and CNS friendly regimen  BILE DUCT STRICTURE -  Stents out, followed by Dr. Henrene Pastor. q 6 months. LFTs are stable.  Hepatic Function Latest Ref Rng & Units 01/16/2017 12/17/2016 06/12/2016  Total Protein 6.1 - 8.1 g/dL 7.8 8.1 8.1  Albumin 3.6 - 5.1 g/dL 4.0 4.1 4.2  AST 10 - 35 U/L 18 19 16   ALT 9 - 46 U/L 16 16 13   Alk Phosphatase 40 - 115 U/L 95 95 102  Total Bilirubin 0.2 - 1.2 mg/dL 0.3 0.3 0.2  Bilirubin, Direct 0.0 - 0.3 mg/dL - 0.0 0.1      ED: viagra rx for him  Hyperlipidemia: On pravastatin   BPH: on finasteride   Send labs and note to Dr. Brigitte Pulse and Dr. Henrene Pastor  I spent greater than 25 minutes with the patient including greater than 50% of time in face to face counsel of the patient regarding his HIV is new  his hyperlipidemia BPH and in coordination of his care.

## 2017-06-16 DIAGNOSIS — N400 Enlarged prostate without lower urinary tract symptoms: Secondary | ICD-10-CM | POA: Diagnosis not present

## 2017-06-16 DIAGNOSIS — R972 Elevated prostate specific antigen [PSA]: Secondary | ICD-10-CM | POA: Diagnosis not present

## 2017-06-30 ENCOUNTER — Other Ambulatory Visit (INDEPENDENT_AMBULATORY_CARE_PROVIDER_SITE_OTHER): Payer: Medicare HMO

## 2017-06-30 DIAGNOSIS — R945 Abnormal results of liver function studies: Principal | ICD-10-CM

## 2017-06-30 DIAGNOSIS — R7989 Other specified abnormal findings of blood chemistry: Secondary | ICD-10-CM

## 2017-06-30 LAB — HEPATIC FUNCTION PANEL
ALBUMIN: 4.2 g/dL (ref 3.5–5.2)
ALT: 12 U/L (ref 0–53)
AST: 19 U/L (ref 0–37)
Alkaline Phosphatase: 92 U/L (ref 39–117)
Bilirubin, Direct: 0.1 mg/dL (ref 0.0–0.3)
TOTAL PROTEIN: 8 g/dL (ref 6.0–8.3)
Total Bilirubin: 0.3 mg/dL (ref 0.2–1.2)

## 2017-07-01 ENCOUNTER — Other Ambulatory Visit: Payer: Self-pay

## 2017-07-01 DIAGNOSIS — R7989 Other specified abnormal findings of blood chemistry: Secondary | ICD-10-CM

## 2017-07-01 DIAGNOSIS — R945 Abnormal results of liver function studies: Principal | ICD-10-CM

## 2017-10-20 ENCOUNTER — Other Ambulatory Visit: Payer: Self-pay | Admitting: *Deleted

## 2017-10-20 DIAGNOSIS — B20 Human immunodeficiency virus [HIV] disease: Secondary | ICD-10-CM

## 2017-10-20 MED ORDER — EFAVIRENZ-EMTRICITAB-TENOFOVIR 600-200-300 MG PO TABS: 1.0000 | ORAL_TABLET | Freq: Every day | ORAL | 3 refills | Status: DC

## 2017-10-21 DIAGNOSIS — N183 Chronic kidney disease, stage 3 (moderate): Secondary | ICD-10-CM | POA: Diagnosis not present

## 2017-10-21 DIAGNOSIS — N400 Enlarged prostate without lower urinary tract symptoms: Secondary | ICD-10-CM | POA: Diagnosis not present

## 2017-10-21 DIAGNOSIS — Z6831 Body mass index (BMI) 31.0-31.9, adult: Secondary | ICD-10-CM | POA: Diagnosis not present

## 2017-10-21 DIAGNOSIS — E782 Mixed hyperlipidemia: Secondary | ICD-10-CM | POA: Diagnosis not present

## 2017-10-21 DIAGNOSIS — R69 Illness, unspecified: Secondary | ICD-10-CM | POA: Diagnosis not present

## 2017-10-21 DIAGNOSIS — Z1211 Encounter for screening for malignant neoplasm of colon: Secondary | ICD-10-CM | POA: Diagnosis not present

## 2017-10-21 DIAGNOSIS — Z125 Encounter for screening for malignant neoplasm of prostate: Secondary | ICD-10-CM | POA: Diagnosis not present

## 2017-10-21 DIAGNOSIS — E669 Obesity, unspecified: Secondary | ICD-10-CM | POA: Diagnosis not present

## 2017-10-21 DIAGNOSIS — Z0001 Encounter for general adult medical examination with abnormal findings: Secondary | ICD-10-CM | POA: Diagnosis not present

## 2017-11-05 DIAGNOSIS — N179 Acute kidney failure, unspecified: Secondary | ICD-10-CM | POA: Diagnosis not present

## 2017-11-05 DIAGNOSIS — R7309 Other abnormal glucose: Secondary | ICD-10-CM | POA: Diagnosis not present

## 2017-11-28 ENCOUNTER — Encounter: Payer: Self-pay | Admitting: Family Medicine

## 2017-12-15 DIAGNOSIS — R972 Elevated prostate specific antigen [PSA]: Secondary | ICD-10-CM | POA: Diagnosis not present

## 2017-12-22 ENCOUNTER — Telehealth: Payer: Self-pay | Admitting: Internal Medicine

## 2017-12-22 ENCOUNTER — Other Ambulatory Visit: Payer: Self-pay

## 2017-12-22 ENCOUNTER — Other Ambulatory Visit (INDEPENDENT_AMBULATORY_CARE_PROVIDER_SITE_OTHER): Payer: Medicare HMO

## 2017-12-22 DIAGNOSIS — R972 Elevated prostate specific antigen [PSA]: Secondary | ICD-10-CM | POA: Diagnosis not present

## 2017-12-22 DIAGNOSIS — R7989 Other specified abnormal findings of blood chemistry: Secondary | ICD-10-CM

## 2017-12-22 DIAGNOSIS — R945 Abnormal results of liver function studies: Secondary | ICD-10-CM

## 2017-12-22 LAB — HEPATIC FUNCTION PANEL
ALBUMIN: 4.2 g/dL (ref 3.5–5.2)
ALT: 14 U/L (ref 0–53)
AST: 18 U/L (ref 0–37)
Alkaline Phosphatase: 94 U/L (ref 39–117)
Bilirubin, Direct: 0.1 mg/dL (ref 0.0–0.3)
TOTAL PROTEIN: 7.9 g/dL (ref 6.0–8.3)
Total Bilirubin: 0.3 mg/dL (ref 0.2–1.2)

## 2017-12-22 NOTE — Telephone Encounter (Signed)
Spoke with pt and let him know he is due the end of this month for labs. Pt aware and orders in epic.

## 2017-12-22 NOTE — Telephone Encounter (Signed)
Patient wants to know when he needs to come in again for labs to check his liver. Pt requesting call back.

## 2017-12-23 ENCOUNTER — Other Ambulatory Visit: Payer: Self-pay

## 2017-12-23 DIAGNOSIS — R945 Abnormal results of liver function studies: Principal | ICD-10-CM

## 2017-12-23 DIAGNOSIS — R7989 Other specified abnormal findings of blood chemistry: Secondary | ICD-10-CM

## 2018-01-01 NOTE — Addendum Note (Signed)
Addended by: Mariea ClontsGREEN, Kareli Hossain D on: 01/01/2018 11:47 AM   Modules accepted: Orders

## 2018-01-01 NOTE — Addendum Note (Signed)
Addended by: Thinh Cuccaro D on: 01/01/2018 11:47 AM   Modules accepted: Orders  

## 2018-01-01 NOTE — Addendum Note (Signed)
Addended by: Mariea ClontsGREEN, Lealand Elting D on: 01/01/2018 11:46 AM   Modules accepted: Orders

## 2018-01-05 ENCOUNTER — Other Ambulatory Visit: Payer: Medicare HMO

## 2018-01-05 ENCOUNTER — Other Ambulatory Visit (HOSPITAL_COMMUNITY)
Admission: RE | Admit: 2018-01-05 | Discharge: 2018-01-05 | Disposition: A | Discharge status: Home / Self Care | Payer: Medicare HMO | Source: Ambulatory Visit | Attending: Infectious Disease | Admitting: Infectious Disease

## 2018-01-05 DIAGNOSIS — B20 Human immunodeficiency virus [HIV] disease: Secondary | ICD-10-CM | POA: Insufficient documentation

## 2018-01-05 DIAGNOSIS — R69 Illness, unspecified: Secondary | ICD-10-CM | POA: Diagnosis not present

## 2018-01-06 LAB — CBC WITH DIFFERENTIAL/PLATELET
BASOS PCT: 0.3 %
Basophils Absolute: 19 cells/uL (ref 0–200)
EOS ABS: 37 {cells}/uL (ref 15–500)
Eosinophils Relative: 0.6 %
HCT: 39.6 % (ref 38.5–50.0)
Hemoglobin: 13.7 g/dL (ref 13.2–17.1)
Lymphs Abs: 2077 cells/uL (ref 850–3900)
MCH: 29.9 pg (ref 27.0–33.0)
MCHC: 34.6 g/dL (ref 32.0–36.0)
MCV: 86.5 fL (ref 80.0–100.0)
MONOS PCT: 7 %
MPV: 9 fL (ref 7.5–12.5)
NEUTROS ABS: 3633 {cells}/uL (ref 1500–7800)
Neutrophils Relative %: 58.6 %
Platelets: 233 10*3/uL (ref 140–400)
RBC: 4.58 10*6/uL (ref 4.20–5.80)
RDW: 13.8 % (ref 11.0–15.0)
Total Lymphocyte: 33.5 %
WBC mixed population: 434 cells/uL (ref 200–950)
WBC: 6.2 10*3/uL (ref 3.8–10.8)

## 2018-01-06 LAB — COMPLETE METABOLIC PANEL WITH GFR
AG Ratio: 1.1 (calc) (ref 1.0–2.5)
ALBUMIN MSPROF: 4 g/dL (ref 3.6–5.1)
ALT: 12 U/L (ref 9–46)
AST: 16 U/L (ref 10–35)
Alkaline phosphatase (APISO): 99 U/L (ref 40–115)
BILIRUBIN TOTAL: 0.2 mg/dL (ref 0.2–1.2)
BUN / CREAT RATIO: 11 (calc) (ref 6–22)
BUN: 17 mg/dL (ref 7–25)
CALCIUM: 9.3 mg/dL (ref 8.6–10.3)
CHLORIDE: 108 mmol/L (ref 98–110)
CO2: 26 mmol/L (ref 20–32)
Creat: 1.51 mg/dL — ABNORMAL HIGH (ref 0.70–1.18)
GFR, EST AFRICAN AMERICAN: 53 mL/min/{1.73_m2} — AB (ref >=60)
GFR, EST NON AFRICAN AMERICAN: 46 mL/min/{1.73_m2} — AB (ref >=60)
GLUCOSE: 106 mg/dL — AB (ref 65–99)
Globulin: 3.6 g/dL (calc) (ref 1.9–3.7)
Potassium: 4.2 mmol/L (ref 3.5–5.3)
Sodium: 141 mmol/L (ref 135–146)
TOTAL PROTEIN: 7.6 g/dL (ref 6.1–8.1)

## 2018-01-06 LAB — URINE CYTOLOGY ANCILLARY ONLY
Chlamydia: NEGATIVE
Neisseria Gonorrhea: NEGATIVE

## 2018-01-06 LAB — RPR: RPR Ser Ql: NONREACTIVE

## 2018-01-07 LAB — T-HELPER CELL (CD4) - (RCID CLINIC ONLY)
CD4 T CELL ABS: 520 /uL (ref 400–2700)
CD4 T CELL HELPER: 22 % — AB (ref 33–55)

## 2018-01-07 LAB — HIV-1 RNA QUANT-NO REFLEX-BLD
HIV 1 RNA QUANT: DETECTED {copies}/mL — AB
HIV-1 RNA Quant, Log: <1.3 Log copies/mL — AB

## 2018-01-19 ENCOUNTER — Encounter: Payer: Self-pay | Admitting: Infectious Disease

## 2018-01-19 ENCOUNTER — Ambulatory Visit (INDEPENDENT_AMBULATORY_CARE_PROVIDER_SITE_OTHER): Payer: Medicare HMO | Admitting: Infectious Disease

## 2018-01-19 VITALS — BP 126/83 | HR 75 | Temp 97.8°F | Ht 69.0 in | Wt 203.0 lb

## 2018-01-19 DIAGNOSIS — I10 Essential (primary) hypertension: Secondary | ICD-10-CM | POA: Diagnosis not present

## 2018-01-19 DIAGNOSIS — N183 Chronic kidney disease, stage 3 unspecified: Secondary | ICD-10-CM

## 2018-01-19 DIAGNOSIS — B20 Human immunodeficiency virus [HIV] disease: Secondary | ICD-10-CM

## 2018-01-19 DIAGNOSIS — K831 Obstruction of bile duct: Secondary | ICD-10-CM

## 2018-01-19 DIAGNOSIS — R69 Illness, unspecified: Secondary | ICD-10-CM | POA: Diagnosis not present

## 2018-01-19 LAB — BASIC METABOLIC PANEL WITH GFR
BUN/Creatinine Ratio: 12 (calc) (ref 6–22)
BUN: 18 mg/dL (ref 7–25)
CALCIUM: 9.5 mg/dL (ref 8.6–10.3)
CHLORIDE: 108 mmol/L (ref 98–110)
CO2: 25 mmol/L (ref 20–32)
Creat: 1.48 mg/dL — ABNORMAL HIGH (ref 0.70–1.18)
GFR, Est African American: 54 mL/min/{1.73_m2} — ABNORMAL LOW (ref >=60)
GFR, Est Non African American: 47 mL/min/{1.73_m2} — ABNORMAL LOW (ref >=60)
Glucose, Bld: 92 mg/dL (ref 65–99)
POTASSIUM: 4.4 mmol/L (ref 3.5–5.3)
Sodium: 138 mmol/L (ref 135–146)

## 2018-01-19 MED ORDER — EFAVIRENZ-LAMIVUDINE-TENOFOVIR 600-300-300 MG PO TABS: 1.0000 | ORAL_TABLET | Freq: Every day | ORAL | 11 refills | Status: DC

## 2018-01-19 MED ORDER — EFAVIRENZ-LAMIVUDINE-TENOFOVIR 600-300-300 MG PO TABS: 1.0000 | ORAL_TABLET | Freq: Every day | ORAL | 4 refills | Status: DC

## 2018-01-19 NOTE — Progress Notes (Signed)
Subjective:  Chief complaint followup for HIV   Patient ID: Jay Hoffman, male    DOB: 16-Feb-1946, 72 y.o.   MRN: [id number]  HPI  Jay Hoffman is a 72 y.o. male with HIV/AIDS, who is doing perfectly on his current regimen of Atripla. I had tried to change him to [facility] but he found the copay was exorbitant with his drug plan. He does not qualify for [name].     Lab Results  Component Value Date   HIV1RNAQUANT <20 DETECTED (A) 01/05/2018   HIV1RNAQUANT 39 (H) 01/16/2017   HIV1RNAQUANT 26 (H) 01/03/2016      Lab Results  Component Value Date   CD4TABS 520 01/05/2018   CD4TABS 460 01/16/2017   CD4TABS 370 (L) 01/03/2016     I really would like to have him on a more modern regimen with TAF but his insurance copays are a problem. Since he is on EFV based STR we can change him tot he generic version by [employer] with is SYMFI    Past Medical History:  Diagnosis Date  . Acute cholecystitis   . BPH (benign prostatic hyperplasia)   . Cryptococcosis ([place])   . Human immunodeficiency virus (HIV) disease ([place])   . Other and unspecified hyperlipidemia   . Pneumocystosis ([place])   . Problems related to high-risk sexual behavior   . Unspecified personal history presenting hazards to health     Past Surgical History:  Procedure Laterality Date  . CHOLECYSTECTOMY    . ERCP      Family History  Problem Relation Age of Onset  . Colon cancer Neg Hx   . Esophageal cancer Father   . Diabetes Paternal Aunt   . Kidney disease Sister       Social History   Socioeconomic History  . Marital status: Married    Spouse name: None  . Number of children: 2  . Years of education: None  . Highest education level: None  Social Needs  . Financial resource strain: None  . Food insecurity - worry: None  . Food insecurity - inability: None  . Transportation needs - medical: None  . Transportation needs - non-medical: None  Occupational History  . Occupation: retired Airline pilot   Tobacco Use  . Smoking status: Never Smoker  . Smokeless tobacco: Never Used  Substance and Sexual Activity  . Alcohol use: No    Comment: occasional  . Drug use: No  . Sexual activity: Yes    Partners: Female    Comment: pt. given condoms  Other Topics Concern  . None  Social History Narrative   Daily caffeine    No Known Allergies   Current Outpatient Medications:  .  efavirenz-emtricitabine-tenofovir (ATRIPLA) 600-200-300 MG tablet, Take 1 tablet at bedtime by mouth., Disp: 90 tablet, Rfl: 3 .  finasteride (PROSCAR) 5 MG tablet, Take 5 mg by mouth daily.  , Disp: , Rfl:  .  pravastatin (PRAVACHOL) 80 MG tablet, , Disp: , Rfl: 5 .  sildenafil (VIAGRA) 50 MG tablet, Take one half to one whole one half hour before sexual activity, Disp: 20 tablet, Rfl: 4   Review of Systems  Constitutional: Negative for activity change, appetite change, chills, diaphoresis, fatigue, fever and unexpected weight change.  HENT: Negative for congestion, rhinorrhea, sinus pressure, sneezing, sore throat and trouble swallowing.   Eyes: Negative for photophobia and visual disturbance.  Respiratory: Negative for cough, chest tightness, shortness of breath, wheezing and stridor.   Cardiovascular: Negative for chest pain,  palpitations and leg swelling.  Gastrointestinal: Negative for abdominal distention, abdominal pain, anal bleeding, blood in stool, constipation, diarrhea, nausea and vomiting.  Genitourinary: Negative for difficulty urinating, dysuria, flank pain and hematuria.  Musculoskeletal: Negative for arthralgias, back pain, gait problem, joint swelling and myalgias.  Skin: Negative for color change, pallor, rash and wound.  Neurological: Negative for dizziness, tremors, weakness and light-headedness.  Hematological: Negative for adenopathy. Does not bruise/bleed easily.  Psychiatric/Behavioral: Negative for agitation, behavioral problems, confusion, decreased concentration, dysphoric mood and  sleep disturbance.       Objective:   Physical Exam  Constitutional: He is oriented to person, place, and time. He appears well-developed and well-nourished. No distress.  HENT:  Head: Normocephalic and atraumatic.  Mouth/Throat: Oropharynx is clear and moist. No oropharyngeal exudate.  Eyes: Conjunctivae and EOM are normal. Pupils are equal, round, and reactive to light. No scleral icterus.  Neck: Normal range of motion. Neck supple. No JVD present.  Cardiovascular: Normal rate, regular rhythm and normal heart sounds. Exam reveals no gallop and no friction rub.  No murmur heard. Pulmonary/Chest: Effort normal and breath sounds normal. No respiratory distress. He has no wheezes. He has no rales. He exhibits no tenderness.  Abdominal: He exhibits no distension and no mass. There is no tenderness. There is no rebound and no guarding.  Musculoskeletal: He exhibits no edema or tenderness.  Lymphadenopathy:    He has no cervical adenopathy.  Neurological: He is alert and oriented to person, place, and time. He has normal reflexes. He exhibits normal muscle tone. Coordination normal.  Skin: Skin is warm and dry. He is not diaphoretic. No erythema. No pallor.  Psychiatric: He has a normal mood and affect. His behavior is normal. Judgment and thought content normal.          Assessment & Plan:  HIV:.cotninue EFV STR. He has several bottles now, but will also let him fax in [place] to pharmacy in [place] he is using. He can RTC in one year    BILE DUCT STRICTURE -  Stents out, followed by Dr. Henrene Pastor. q 6 months. LFTs are stable.  Hepatic Function Latest Ref Rng & Units 01/05/2018 12/22/2017 06/30/2017  Total Protein 6.1 - 8.1 g/dL 7.6 7.9 8.0  Albumin 3.5 - 5.2 g/dL - 4.2 4.2  AST 10 - 35 U/L 16 18 19   ALT 9 - 46 U/L 12 14 12   Alk Phosphatase 39 - 117 U/L - 94 92  Total Bilirubin 0.2 - 1.2 mg/dL 0.2 0.3 0.3  Bilirubin, Direct 0.0 - 0.3 mg/dL - 0.1 0.1    Hyperlipidemia: On pravastatin    BPH: on finasteride  Acute on chronic kidney disease: really a pity his insurance wont let us rx him a more "kidney friendly regimen" Perhaps when we get DTG-3TC two drug regimen this will be cheaper but I would expect it might be more than generic I am changing him to. I will check repeat BMP since Cr is up and worsened  I spent greater than 25 minutes with the patient including greater than 50% of time in face to face counsel of the patient re his ARV regimen options insurance, pharmacy issues  and in coordination of his care with ID pharmacy.

## 2018-02-05 DIAGNOSIS — R7303 Prediabetes: Secondary | ICD-10-CM | POA: Diagnosis not present

## 2018-06-15 ENCOUNTER — Other Ambulatory Visit (INDEPENDENT_AMBULATORY_CARE_PROVIDER_SITE_OTHER): Payer: Medicare HMO

## 2018-06-15 DIAGNOSIS — R7989 Other specified abnormal findings of blood chemistry: Secondary | ICD-10-CM

## 2018-06-15 DIAGNOSIS — R945 Abnormal results of liver function studies: Secondary | ICD-10-CM

## 2018-06-15 LAB — HEPATIC FUNCTION PANEL
ALT: 15 U/L (ref 0–53)
AST: 17 U/L (ref 0–37)
Albumin: 4.1 g/dL (ref 3.5–5.2)
Alkaline Phosphatase: 92 U/L (ref 39–117)
BILIRUBIN DIRECT: 0.1 mg/dL (ref 0.0–0.3)
BILIRUBIN TOTAL: 0.3 mg/dL (ref 0.2–1.2)
Total Protein: 7.7 g/dL (ref 6.0–8.3)

## 2018-11-05 ENCOUNTER — Telehealth: Payer: Self-pay | Admitting: Pharmacist

## 2018-11-05 DIAGNOSIS — B20 Human immunodeficiency virus [HIV] disease: Secondary | ICD-10-CM

## 2018-11-05 NOTE — Telephone Encounter (Signed)
Patient called and talked to Morrie SheldonAshley, RN about a refill for Atripla.  Morrie Sheldonshley brought me in to look over his profile. Patient has had trouble paying for his high co-pays for his HIV medications.  He has been paying around [phone] for a 90 day supply for Atripla through a pharmacy in [place].  It looks like he has been over income for [name] in the past. It also looks like Dr. Daiva EvesVan Dam has wanted to change his ART to a more friendly regimen without long term side effects but he has been unable to switch to something due to his high co-pay.  Called to discuss with patient.  We will try to change him to [facility] and get him assistance through PAF organization. His co-pay for that is right around $1000 per month.  Kathie RhodesBetty called and got information. Patient will bring income documentation tomorrow for us to apply for him.  Hopefully we can get him approved.

## 2018-11-10 MED ORDER — DOLUTEGRAVIR-LAMIVUDINE 50-300 MG PO TABS: 1.0000 | ORAL_TABLET | Freq: Every day | ORAL | 11 refills | Status: DC

## 2018-11-10 MED FILL — DOVATO 50-300 MG TABS: 50-300 | 30 days supply | Qty: 30 | Fill #0

## 2018-11-10 NOTE — Telephone Encounter (Signed)
Jay RhodesBetty was able to get co-pay assistance for patient through [employer]. He is approved for $7500 for the year with only a $15 co-pay per month.  We tried several different medications to see what the lowest co-pay for a 30 day supply would be with the newer medications: - Biktarvy $1042/month - Genvoya $1079/month - Triumeq $10111/month - Atripla $1001/month - Symtuza $1242/month - Odefsey $986/month - Juluca $922/month - [name] $791/month  Because of this, we choose to switch him to [name]. Dr. Daiva EvesVan Dam did mention this in his note from February as well. The co-pay relief fund will cover him for most of the year and he is ok with paying the rest out of pocket when the time comes.  He is very excited about this co-pay relief.   I explained [name] to him and how to take it and the difference between that and Atripla.  [facility] will mail it to him today and he will start it tomorrow.

## 2018-11-10 NOTE — Telephone Encounter (Signed)
You so much for your hard work on this  Cassie that makes sense.

## 2018-11-10 NOTE — Addendum Note (Signed)
Addended by: Aggie CosierKUPPELWEISER, Lareta Bruneau L on: 11/10/2018 12:15 PM   Modules accepted: Orders

## 2018-11-12 DIAGNOSIS — Z1211 Encounter for screening for malignant neoplasm of colon: Secondary | ICD-10-CM | POA: Diagnosis not present

## 2018-11-12 DIAGNOSIS — R945 Abnormal results of liver function studies: Secondary | ICD-10-CM | POA: Diagnosis not present

## 2018-11-12 DIAGNOSIS — Z125 Encounter for screening for malignant neoplasm of prostate: Secondary | ICD-10-CM | POA: Diagnosis not present

## 2018-11-12 DIAGNOSIS — N400 Enlarged prostate without lower urinary tract symptoms: Secondary | ICD-10-CM | POA: Diagnosis not present

## 2018-11-12 DIAGNOSIS — E782 Mixed hyperlipidemia: Secondary | ICD-10-CM | POA: Diagnosis not present

## 2018-11-12 DIAGNOSIS — Z0001 Encounter for general adult medical examination with abnormal findings: Secondary | ICD-10-CM | POA: Diagnosis not present

## 2018-11-12 DIAGNOSIS — R69 Illness, unspecified: Secondary | ICD-10-CM | POA: Diagnosis not present

## 2018-11-12 DIAGNOSIS — R7303 Prediabetes: Secondary | ICD-10-CM | POA: Diagnosis not present

## 2018-11-12 DIAGNOSIS — R972 Elevated prostate specific antigen [PSA]: Secondary | ICD-10-CM | POA: Diagnosis not present

## 2018-11-12 DIAGNOSIS — Z6831 Body mass index (BMI) 31.0-31.9, adult: Secondary | ICD-10-CM | POA: Diagnosis not present

## 2018-11-12 DIAGNOSIS — N183 Chronic kidney disease, stage 3 (moderate): Secondary | ICD-10-CM | POA: Diagnosis not present

## 2018-11-17 DIAGNOSIS — H401133 Primary open-angle glaucoma, bilateral, severe stage: Secondary | ICD-10-CM | POA: Diagnosis not present

## 2018-11-17 DIAGNOSIS — H25813 Combined forms of age-related cataract, bilateral: Secondary | ICD-10-CM | POA: Diagnosis not present

## 2018-11-27 DIAGNOSIS — N179 Acute kidney failure, unspecified: Secondary | ICD-10-CM | POA: Diagnosis not present

## 2018-12-07 ENCOUNTER — Other Ambulatory Visit: Payer: Self-pay

## 2018-12-07 DIAGNOSIS — R945 Abnormal results of liver function studies: Principal | ICD-10-CM

## 2018-12-07 DIAGNOSIS — R7989 Other specified abnormal findings of blood chemistry: Secondary | ICD-10-CM

## 2018-12-07 MED FILL — DOVATO 50-300 MG TABS: 50-300 | 30 days supply | Qty: 30 | Fill #1

## 2018-12-08 ENCOUNTER — Other Ambulatory Visit (INDEPENDENT_AMBULATORY_CARE_PROVIDER_SITE_OTHER): Payer: Medicare HMO

## 2018-12-08 ENCOUNTER — Encounter: Payer: Self-pay | Admitting: Internal Medicine

## 2018-12-08 DIAGNOSIS — R7989 Other specified abnormal findings of blood chemistry: Secondary | ICD-10-CM

## 2018-12-08 DIAGNOSIS — H25813 Combined forms of age-related cataract, bilateral: Secondary | ICD-10-CM | POA: Diagnosis not present

## 2018-12-08 DIAGNOSIS — R945 Abnormal results of liver function studies: Secondary | ICD-10-CM

## 2018-12-08 DIAGNOSIS — H401133 Primary open-angle glaucoma, bilateral, severe stage: Secondary | ICD-10-CM | POA: Diagnosis not present

## 2018-12-08 LAB — HEPATIC FUNCTION PANEL
ALT: 13 U/L (ref 0–53)
AST: 16 U/L (ref 0–37)
Albumin: 4.2 g/dL (ref 3.5–5.2)
Alkaline Phosphatase: 94 U/L (ref 39–117)
BILIRUBIN TOTAL: 0.4 mg/dL (ref 0.2–1.2)
Bilirubin, Direct: 0.1 mg/dL (ref 0.0–0.3)
Total Protein: 7.8 g/dL (ref 6.0–8.3)

## 2018-12-09 ENCOUNTER — Other Ambulatory Visit: Payer: Self-pay

## 2018-12-09 DIAGNOSIS — R7989 Other specified abnormal findings of blood chemistry: Secondary | ICD-10-CM

## 2018-12-09 DIAGNOSIS — R945 Abnormal results of liver function studies: Principal | ICD-10-CM

## 2018-12-28 DIAGNOSIS — R972 Elevated prostate specific antigen [PSA]: Secondary | ICD-10-CM | POA: Diagnosis not present

## 2018-12-31 ENCOUNTER — Telehealth: Payer: Self-pay | Admitting: Internal Medicine

## 2018-12-31 ENCOUNTER — Ambulatory Visit (AMBULATORY_SURGERY_CENTER): Payer: Self-pay

## 2018-12-31 ENCOUNTER — Encounter: Payer: Self-pay | Admitting: Internal Medicine

## 2018-12-31 VITALS — Ht 69.0 in | Wt 202.2 lb

## 2018-12-31 DIAGNOSIS — Z1211 Encounter for screening for malignant neoplasm of colon: Secondary | ICD-10-CM

## 2018-12-31 MED ORDER — NA SULFATE-K SULFATE-MG SULF 17.5-3.13-1.6 GM/177ML PO SOLN: 1.0000 | Freq: Once | ORAL | 0 refills | Status: DC

## 2018-12-31 MED ORDER — NA SULFATE-K SULFATE-MG SULF 17.5-3.13-1.6 GM/177ML PO SOLN: 1.0000 | Freq: Once | ORAL | 0 refills | Status: AC

## 2018-12-31 NOTE — Telephone Encounter (Signed)
Pt advised that the med Na Sulfate-K Sulfate-Mg was sent to the wrong pharmacy [facility] [address] Ottis Stain

## 2018-12-31 NOTE — Telephone Encounter (Signed)
[facility] [address]

## 2018-12-31 NOTE — Progress Notes (Signed)
Denies allergies to eggs or soy products. Denies complication of anesthesia or sedation. Denies use of weight loss medication. Denies use of O2.   Emmi instructions declined.   Patient verbalizes understanding of instructions.

## 2019-01-01 DIAGNOSIS — N400 Enlarged prostate without lower urinary tract symptoms: Secondary | ICD-10-CM | POA: Diagnosis not present

## 2019-01-01 DIAGNOSIS — R972 Elevated prostate specific antigen [PSA]: Secondary | ICD-10-CM | POA: Diagnosis not present

## 2019-01-05 ENCOUNTER — Other Ambulatory Visit: Payer: Medicare HMO

## 2019-01-05 DIAGNOSIS — R69 Illness, unspecified: Secondary | ICD-10-CM | POA: Diagnosis not present

## 2019-01-05 DIAGNOSIS — I10 Essential (primary) hypertension: Secondary | ICD-10-CM | POA: Diagnosis not present

## 2019-01-05 DIAGNOSIS — K831 Obstruction of bile duct: Secondary | ICD-10-CM

## 2019-01-05 DIAGNOSIS — B20 Human immunodeficiency virus [HIV] disease: Secondary | ICD-10-CM

## 2019-01-05 MED FILL — DOVATO 50-300 MG TABS: 50-300 | 30 days supply | Qty: 30 | Fill #2

## 2019-01-06 LAB — T-HELPER CELL (CD4) - (RCID CLINIC ONLY)
CD4 % Helper T Cell: 21 % — ABNORMAL LOW (ref 33–55)
CD4 T Cell Abs: 380 /uL — ABNORMAL LOW (ref 400–2700)

## 2019-01-07 LAB — CBC WITH DIFFERENTIAL/PLATELET
Absolute Monocytes: 330 cells/uL (ref 200–950)
Basophils Absolute: 22 cells/uL (ref 0–200)
Basophils Relative: 0.4 %
EOS ABS: 50 {cells}/uL (ref 15–500)
Eosinophils Relative: 0.9 %
HCT: 41.3 % (ref 38.5–50.0)
Hemoglobin: 14.4 g/dL (ref 13.2–17.1)
Lymphs Abs: 1775 cells/uL (ref 850–3900)
MCH: 30 pg (ref 27.0–33.0)
MCHC: 34.9 g/dL (ref 32.0–36.0)
MCV: 86 fL (ref 80.0–100.0)
MPV: 9.3 fL (ref 7.5–12.5)
Monocytes Relative: 5.9 %
Neutro Abs: 3422 cells/uL (ref 1500–7800)
Neutrophils Relative %: 61.1 %
Platelets: 258 10*3/uL (ref 140–400)
RBC: 4.8 10*6/uL (ref 4.20–5.80)
RDW: 13.7 % (ref 11.0–15.0)
Total Lymphocyte: 31.7 %
WBC: 5.6 10*3/uL (ref 3.8–10.8)

## 2019-01-07 LAB — COMPLETE METABOLIC PANEL WITH GFR
AG Ratio: 1.1 (calc) (ref 1.0–2.5)
ALBUMIN MSPROF: 4.1 g/dL (ref 3.6–5.1)
ALT: 13 U/L (ref 9–46)
AST: 15 U/L (ref 10–35)
Alkaline phosphatase (APISO): 98 U/L (ref 35–144)
BUN/Creatinine Ratio: 13 (calc) (ref 6–22)
BUN: 17 mg/dL (ref 7–25)
CO2: 26 mmol/L (ref 20–32)
Calcium: 9.2 mg/dL (ref 8.6–10.3)
Chloride: 108 mmol/L (ref 98–110)
Creat: 1.3 mg/dL — ABNORMAL HIGH (ref 0.70–1.18)
GFR, EST NON AFRICAN AMERICAN: 55 mL/min/{1.73_m2} — AB (ref >=60)
GFR, Est African American: 63 mL/min/{1.73_m2} (ref >=60)
Globulin: 3.7 g/dL (calc) (ref 1.9–3.7)
Glucose, Bld: 112 mg/dL — ABNORMAL HIGH (ref 65–99)
Potassium: 4.1 mmol/L (ref 3.5–5.3)
Sodium: 140 mmol/L (ref 135–146)
Total Bilirubin: 0.2 mg/dL (ref 0.2–1.2)
Total Protein: 7.8 g/dL (ref 6.1–8.1)

## 2019-01-07 LAB — RPR: RPR Ser Ql: NONREACTIVE

## 2019-01-07 LAB — LIPID PANEL
CHOLESTEROL: 126 mg/dL (ref <200)
HDL: 34 mg/dL — ABNORMAL LOW (ref >=40)
LDL Cholesterol (Calc): 75 mg/dL (calc)
Non-HDL Cholesterol (Calc): 92 mg/dL (calc) (ref <130)
Total CHOL/HDL Ratio: 3.7 (calc) (ref <5.0)
Triglycerides: 88 mg/dL (ref <150)

## 2019-01-07 LAB — HIV-1 RNA QUANT-NO REFLEX-BLD
HIV 1 RNA Quant: <20 copies/mL
HIV-1 RNA Quant, Log: <1.3 Log copies/mL

## 2019-01-12 DIAGNOSIS — H401133 Primary open-angle glaucoma, bilateral, severe stage: Secondary | ICD-10-CM | POA: Diagnosis not present

## 2019-01-12 DIAGNOSIS — H2513 Age-related nuclear cataract, bilateral: Secondary | ICD-10-CM | POA: Diagnosis not present

## 2019-01-14 ENCOUNTER — Encounter: Payer: Self-pay | Admitting: Internal Medicine

## 2019-01-14 ENCOUNTER — Ambulatory Visit (AMBULATORY_SURGERY_CENTER): Payer: Medicare HMO | Admitting: Internal Medicine

## 2019-01-14 VITALS — BP 120/80 | HR 67 | Temp 98.6°F | Resp 11 | Ht 69.0 in | Wt 202.0 lb

## 2019-01-14 DIAGNOSIS — I10 Essential (primary) hypertension: Secondary | ICD-10-CM | POA: Diagnosis not present

## 2019-01-14 DIAGNOSIS — E669 Obesity, unspecified: Secondary | ICD-10-CM | POA: Diagnosis not present

## 2019-01-14 DIAGNOSIS — Z1211 Encounter for screening for malignant neoplasm of colon: Secondary | ICD-10-CM | POA: Diagnosis not present

## 2019-01-14 MED ORDER — SODIUM CHLORIDE 0.9 % IV SOLN: 500.0000 mL | Freq: Once | INTRAVENOUS | Status: AC

## 2019-01-14 NOTE — Progress Notes (Signed)
Pt's states no medical or surgical changes since previsit or office visit. 

## 2019-01-14 NOTE — Op Note (Signed)
[facility] Patient Name: Roxana HiresOneil Bonifield Procedure Date: 01/14/2019 10:19 AM MRN: [id number] Endoscopist: Wilhemina BonitoJohn N. Marina GoodellPerry , MD Age: [age] Referring MD:  Date of Birth: 01-23-1946 Gender: Male Account #: [id number] Procedure:                Colonoscopy Indications:              Screening for colorectal malignant neoplasm.                            Negative index exam elsewhere (Dr. Annia FriendlyJohnson Eagle)                            2008 Medicines:                Monitored Anesthesia Care Procedure:                Pre-Anesthesia Assessment:                           - Prior to the procedure, a History and Physical                            was performed, and patient medications and                            allergies were reviewed. The patient's tolerance of                            previous anesthesia was also reviewed. The risks                            and benefits of the procedure and the sedation                            options and risks were discussed with the patient.                            All questions were answered, and informed consent                            was obtained. Prior Anticoagulants: The patient has                            taken no previous anticoagulant or antiplatelet                            agents. ASA Grade Assessment: II - A patient with                            mild systemic disease. After reviewing the risks                            and benefits, the patient was deemed in  satisfactory condition to undergo the procedure.                           After obtaining informed consent, the colonoscope                            was passed under direct vision. Throughout the                            procedure, the patient's blood pressure, pulse, and                            oxygen saturations were monitored continuously. The                            Colonoscope was introduced through the anus and                 advanced to the the cecum, identified by                            appendiceal orifice and ileocecal valve. The                            ileocecal valve, appendiceal orifice, and rectum                            were photographed. The quality of the bowel                            preparation was excellent. The colonoscopy was                            performed without difficulty. The patient tolerated                            the procedure well. The bowel preparation used was                            SUPREP. Scope In: 10:32:59 AM Scope Out: 10:45:22 AM Scope Withdrawal Time: 0 hours 9 minutes 31 seconds  Total Procedure Duration: 0 hours 12 minutes 23 seconds  Findings:                 A few diverticula were found in the left colon.                           The exam was otherwise without abnormality on                            direct and retroflexion views. Complications:            No immediate complications. Estimated blood loss:                            None. Estimated Blood Loss:     Estimated blood loss: none.  Impression:               - Diverticulosis in the left colon.                           - The examination was otherwise normal on direct                            and retroflexion views.                           - No specimens collected. Recommendation:           - Repeat colonoscopy is not recommended due to                            current age and exam findings.                           - Patient has a contact number available for                            emergencies. The signs and symptoms of potential                            delayed complications were discussed with the                            patient. Return to normal activities tomorrow.                            Written discharge instructions were provided to the                            patient.                           - Resume previous diet.                            - Continue present medications. Wilhemina Bonito. Marina Goodell, MD 01/14/2019 10:49:36 AM This report has been signed electronically.

## 2019-01-14 NOTE — Progress Notes (Signed)
Report to PACU, RN, vss, BBS= Clear.  

## 2019-01-14 NOTE — Patient Instructions (Signed)
YOU HAD AN ENDOSCOPIC PROCEDURE TODAY AT THE [facility]:   Refer to the procedure report that was given to you for any specific questions about what was found during the examination.  If the procedure report does not answer your questions, please call your gastroenterologist to clarify.  If you requested that your care partner not be given the details of your procedure findings, then the procedure report has been included in a sealed envelope for you to review at your convenience later.  YOU SHOULD EXPECT: Some feelings of bloating in the abdomen. Passage of more gas than usual.  Walking can help get rid of the air that was put into your GI tract during the procedure and reduce the bloating. If you had a lower endoscopy (such as a colonoscopy or flexible sigmoidoscopy) you may notice spotting of blood in your stool or on the toilet paper. If you underwent a bowel prep for your procedure, you may not have a normal bowel movement for a few days.  Please Note:  You might notice some irritation and congestion in your nose or some drainage.  This is from the oxygen used during your procedure.  There is no need for concern and it should clear up in a day or so.  SYMPTOMS TO REPORT IMMEDIATELY:   Following lower endoscopy (colonoscopy or flexible sigmoidoscopy):  Excessive amounts of blood in the stool  Significant tenderness or worsening of abdominal pains  Swelling of the abdomen that is new, acute  Fever of 100F or higher  For urgent or emergent issues, a gastroenterologist can be reached at any hour by calling [phone].   DIET:  We do recommend a small meal at first, but then you may proceed to your regular diet.  Drink plenty of fluids but you should avoid alcoholic beverages for 24 hours.  ACTIVITY:  You should plan to take it easy for the rest of today and you should NOT DRIVE or use heavy machinery until tomorrow (because of the sedation medicines used during the test).     FOLLOW UP: Our staff will call the number listed on your records the next business day following your procedure to check on you and address any questions or concerns that you may have regarding the information given to you following your procedure. If we do not reach you, we will leave a message.  However, if you are feeling well and you are not experiencing any problems, there is no need to return our call.  We will assume that you have returned to your regular daily activities without incident.  If any biopsies were taken you will be contacted by phone or by letter within the next 1-3 weeks.  Please call us at [phone] if you have not heard about the biopsies in 3 weeks.    SIGNATURES/CONFIDENTIALITY: You and/or your care partner have signed paperwork which will be entered into your electronic medical record.  These signatures attest to the fact that that the information above on your After Visit Summary has been reviewed and is understood.  Full responsibility of the confidentiality of this discharge information lies with you and/or your care-partner. 

## 2019-01-15 ENCOUNTER — Telehealth: Payer: Self-pay | Admitting: *Deleted

## 2019-01-15 NOTE — Telephone Encounter (Signed)
  Follow up Call-  Call back number 01/14/2019  Post procedure Call Back phone  # [phone]  Permission to leave phone message Yes  Some recent data might be hidden     Patient questions:  Do you have a fever, pain , or abdominal swelling? No. Pain Score  0 *  Have you tolerated food without any problems? Yes.    Have you been able to return to your normal activities? Yes.    Do you have any questions about your discharge instructions: Diet   No. Medications  No. Follow up visit  No.  Do you have questions or concerns about your Care? No.  Actions: * If pain score is 4 or above: No action needed, pain <4.

## 2019-01-15 NOTE — Telephone Encounter (Signed)
Left message on f/u call 

## 2019-01-19 ENCOUNTER — Encounter: Payer: Self-pay | Admitting: Infectious Diseases

## 2019-01-19 ENCOUNTER — Ambulatory Visit (INDEPENDENT_AMBULATORY_CARE_PROVIDER_SITE_OTHER): Payer: Medicare HMO | Admitting: Infectious Diseases

## 2019-01-19 VITALS — BP 133/74 | HR 71 | Temp 97.7°F | Wt 201.0 lb

## 2019-01-19 DIAGNOSIS — N189 Chronic kidney disease, unspecified: Secondary | ICD-10-CM | POA: Insufficient documentation

## 2019-01-19 DIAGNOSIS — N183 Chronic kidney disease, stage 3 unspecified: Secondary | ICD-10-CM

## 2019-01-19 DIAGNOSIS — E785 Hyperlipidemia, unspecified: Secondary | ICD-10-CM | POA: Diagnosis not present

## 2019-01-19 DIAGNOSIS — R69 Illness, unspecified: Secondary | ICD-10-CM | POA: Diagnosis not present

## 2019-01-19 DIAGNOSIS — B20 Human immunodeficiency virus [HIV] disease: Secondary | ICD-10-CM | POA: Diagnosis not present

## 2019-01-19 NOTE — Assessment & Plan Note (Addendum)
Diagnosed in early 2000s and has been under excellent control since. He has only just recently over the last few weeks switched to taking [name] every day. VL recently undetectable again. He will return again in 1 year for regular follow up care with Dr. Daiva Eves. He will reach out should he have any trouble accessing his medications or at risk for running out.  Declined STI screening today.  He is UTD with vaccines and recommended health screenings for his age/conditions.

## 2019-01-19 NOTE — Assessment & Plan Note (Signed)
LDL < 100 currently in this non-diabetic patient. Reviewed results with him - no changes.

## 2019-01-19 NOTE — Patient Instructions (Signed)
Wonderful to meet you!   Happy to hear you are done with colonoscopies.   No further vaccines recommended at this time.   Please continue taking your [name] every day around the same time.   We can repeat your blood work in 6 months to follow if you like - if not will have you return in 1 year to see Dr. Daiva Eves.   Be Well!

## 2019-01-19 NOTE — Assessment & Plan Note (Signed)
Lab Results  Component Value Date   CREATININE 1.30 (H) 01/05/2019   CREATININE 1.48 (H) 01/19/2018   CREATININE 1.51 (H) 01/05/2018   Creatinine stable and actually trending down. He is currently on TAF sparing regimen. No change - follow again in 1 year.

## 2019-01-19 NOTE — Progress Notes (Signed)
Name: Jay Hoffman  DOB: 22-Jan-1946 MRN: [id number] PCP: Lupita RaiderShaw, Kimberlee, MD    Patient Active Problem List   Diagnosis Date Noted  . Chronic kidney disease (CKD) 01/19/2019  . AIDS (HCC) 12/26/2014  . Erectile dysfunction 05/13/2011  . FATIGUE 11/19/2010  . 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 Essential hypertension 08/08/2010  . PERFORATION OF BILE DUCT 07/30/2010  . Hyperlipidemia 11/09/2007  . BPH (benign prostatic hyperplasia) 09/21/2007  . Human immunodeficiency virus (HIV) disease (HCC) 12/10/2006  . 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 PNEUMOCYSTIS PNEUMONIA 12/10/2006     Subjective:  CC:  HIV follow up care. No concerns today.   HPI: Jay Hoffman is a 73 y.o. male with HIV disease well controlled for many years. He tells me he just started taking his [name] every day in early this month; he has been very nervous about financial concerns surrounding his medication and chose to take his Atripla for a few months prior to decision to switch. So far he has not noticed any trouble or adverse effect with switching. He is taking this once a day without food as he was with other regimens.   He has no updates to add to his health history today and reports to be in good health. He had his "lats colonoscopy ever" a few weeks ago and happy he does not need to continue these screenings. Not currently sexually active. Actively seeing PCP and a dentist locally. Up to date on all vaccines per his account and received shingles shot a few years back.   Depression screen [facility] 01/19/2019  Decreased Interest 0  Down, Depressed, Hopeless 0  PHQ - 2 Score 0    Review of Systems  Constitutional: Negative for chills, fever, malaise/fatigue and weight loss.  HENT: Negative for sore throat.        No dental problems  Respiratory: Negative for cough and sputum production.   Cardiovascular: Negative for chest pain and leg swelling.  Gastrointestinal: Negative for abdominal pain, diarrhea and  vomiting.  Genitourinary: Negative for dysuria and flank pain.  Musculoskeletal: Negative for joint pain, myalgias and neck pain.  Skin: Negative for rash.  Neurological: Negative for dizziness, tingling and headaches.  Psychiatric/Behavioral: Negative for depression and substance abuse. The patient is not nervous/anxious and does not have insomnia.     Past Medical History:  Diagnosis Date  . Acute cholecystitis   . BPH (benign prostatic hyperplasia)   . Cataract   . Cryptococcosis (HCC)   . GERD (gastroesophageal reflux disease)   . Glaucoma   . Human immunodeficiency virus (HIV) disease (HCC)   . Other and unspecified hyperlipidemia   . Pneumocystosis (HCC)   . Problems related to high-risk sexual behavior   . Unspecified personal history presenting hazards to health     Outpatient Medications Prior to Visit  Medication Sig Dispense Refill  . Dolutegravir-lamiVUDine ([name]) 50-300 MG TABS Take 1 tablet by mouth daily. 30 tablet 11  . dorzolamide-timolol (COSOPT) 22.3-6.8 MG/ML ophthalmic solution     . finasteride (PROSCAR) 5 MG tablet Take 5 mg by mouth daily.      . pravastatin (PRAVACHOL) 80 MG tablet   5  . PRESCRIPTION MEDICATION 3 prescription eye gtts for glaucoma    . pseudoephedrine-guaifenesin (MUCINEX D) 60-600 MG 12 hr tablet Take 1 tablet by mouth every 12 (twelve) hours.    . sildenafil (VIAGRA) 50 MG tablet Take one half to one whole one half hour before sexual activity 20 tablet 4  Facility-Administered Medications Prior to Visit  Medication Dose Route Frequency Provider Last Rate Last Dose  . 0.9 %  sodium chloride infusion  500 mL Intravenous Once Hilarie Fredrickson, MD         No Known Allergies  Social History   Tobacco Use  . Smoking status: Never Smoker  . Smokeless tobacco: Never Used  Substance Use Topics  . Alcohol use: Yes    Comment: occasional   Beer  . Drug use: No    Family History  Problem Relation Age of Onset  . Esophageal cancer  Father   . Kidney disease Sister   . Diabetes Paternal Aunt   . Colon cancer Neg Hx   . Rectal cancer Neg Hx   . Stomach cancer Neg Hx     Social History   Substance and Sexual Activity  Sexual Activity Yes  . Partners: Female   Comment: pt. given condoms     Objective:   Vitals:   01/19/19 0850  BP: 133/74  Pulse: 71  Temp: 97.7 F (36.5 C)  Weight: 201 lb (91.2 kg)   Body mass index is 29.68 kg/m.  Physical Exam Constitutional:      Appearance: He is well-developed.     Comments: Seated comfortably in chair during visit.   HENT:     Mouth/Throat:     Dentition: Normal dentition. No dental abscesses.  Cardiovascular:     Rate and Rhythm: Normal rate and regular rhythm.     Heart sounds: Normal heart sounds.  Pulmonary:     Effort: Pulmonary effort is normal.     Breath sounds: Normal breath sounds.  Abdominal:     General: There is no distension.     Palpations: Abdomen is soft.     Tenderness: There is no abdominal tenderness.  Lymphadenopathy:     Cervical: No cervical adenopathy.  Skin:    General: Skin is warm and dry.     Findings: No rash.  Neurological:     Mental Status: He is alert and oriented to person, place, and time.  Psychiatric:        Judgment: Judgment normal.     Comments: In good spirits today and engaged in care discussion.      Lab Results Lab Results  Component Value Date   WBC 5.6 01/05/2019   HGB 14.4 01/05/2019   HCT 41.3 01/05/2019   MCV 86.0 01/05/2019   PLT 258 01/05/2019    Lab Results  Component Value Date   CREATININE 1.30 (H) 01/05/2019   BUN 17 01/05/2019   NA 140 01/05/2019   K 4.1 01/05/2019   CL 108 01/05/2019   CO2 26 01/05/2019    Lab Results  Component Value Date   ALT 13 01/05/2019   AST 15 01/05/2019   ALKPHOS 94 12/08/2018   BILITOT 0.2 01/05/2019    Lab Results  Component Value Date   CHOL 126 01/05/2019   HDL 34 (L) 01/05/2019   LDLCALC 75 01/05/2019   TRIG 88 01/05/2019   CHOLHDL  3.7 01/05/2019   HIV 1 RNA Quant (copies/mL)  Date Value  01/05/2019 <20 NOT DETECTED  01/05/2018 <20 DETECTED (A)  01/16/2017 39 (H)   CD4 T Cell Abs (/uL)  Date Value  01/05/2019 380 (L)  01/05/2018 520  01/16/2017 460     Assessment & Plan:   Problem List Items Addressed This Visit      Unprioritized   Chronic kidney disease (CKD)  Lab Results  Component Value Date   CREATININE 1.30 (H) 01/05/2019   CREATININE 1.48 (H) 01/19/2018   CREATININE 1.51 (H) 01/05/2018   Creatinine stable and actually trending down. He is currently on TAF sparing regimen. No change - follow again in 1 year.       Human immunodeficiency virus (HIV) disease (HCC) - Primary    Diagnosed in early 2000s and has been under excellent control since. He has only just recently over the last few weeks switched to taking [name] every day. VL recently undetectable again. He will return again in 1 year for regular follow up care with Dr. Daiva Eves. He will reach out should he have any trouble accessing his medications or at risk for running out.  Declined STI screening today.  He is UTD with vaccines and recommended health screenings for his age/conditions.       Relevant Orders   CBC   T-helper cell (CD4)- ([facility] clinic only)   Comprehensive metabolic panel   Lipid panel   RPR   HIV-1 RNA quant-no reflex-bld   Urine cytology ancillary only   Hyperlipidemia    LDL < 100 currently in this non-diabetic patient. Reviewed results with him - no changes.          Rexene Alberts, MSN, NP-C [facility] for Infectious Disease [facility] Pager: [phone] Office: [phone]  01/19/19  12:20 PM

## 2019-01-28 MED FILL — DOVATO 50-300 MG TABS: 50-300 | 30 days supply | Qty: 30 | Fill #3

## 2019-02-25 MED FILL — DOVATO 50-300 MG TABS: 50-300 | 30 days supply | Qty: 30 | Fill #4

## 2019-04-01 MED FILL — DOVATO 50-300 MG TABS: 50-300 | 30 days supply | Qty: 30 | Fill #5

## 2019-04-12 DIAGNOSIS — H2513 Age-related nuclear cataract, bilateral: Secondary | ICD-10-CM | POA: Diagnosis not present

## 2019-04-12 DIAGNOSIS — H401133 Primary open-angle glaucoma, bilateral, severe stage: Secondary | ICD-10-CM | POA: Diagnosis not present

## 2019-05-03 MED FILL — DOVATO 50-300 MG TABS: 50-300 | 30 days supply | Qty: 30 | Fill #6

## 2019-06-01 MED FILL — DOVATO 50-300 MG TABS: 50-300 | 30 days supply | Qty: 30 | Fill #7

## 2019-06-14 ENCOUNTER — Other Ambulatory Visit: Payer: Self-pay

## 2019-06-14 ENCOUNTER — Other Ambulatory Visit (INDEPENDENT_AMBULATORY_CARE_PROVIDER_SITE_OTHER): Payer: Medicare HMO

## 2019-06-14 DIAGNOSIS — R945 Abnormal results of liver function studies: Secondary | ICD-10-CM | POA: Diagnosis not present

## 2019-06-14 DIAGNOSIS — R7989 Other specified abnormal findings of blood chemistry: Secondary | ICD-10-CM

## 2019-06-14 LAB — HEPATIC FUNCTION PANEL
ALT: 10 U/L (ref 0–53)
AST: 14 U/L (ref 0–37)
Albumin: 4.1 g/dL (ref 3.5–5.2)
Alkaline Phosphatase: 72 U/L (ref 39–117)
Bilirubin, Direct: 0.1 mg/dL (ref 0.0–0.3)
Total Bilirubin: 0.4 mg/dL (ref 0.2–1.2)
Total Protein: 7.8 g/dL (ref 6.0–8.3)

## 2019-07-05 MED FILL — DOVATO 50-300 MG TABS: 50-300 | 30 days supply | Qty: 30 | Fill #8

## 2019-08-05 MED FILL — DOVATO 50-300 MG TABS: 50-300 | 30 days supply | Qty: 30 | Fill #9

## 2019-08-11 DIAGNOSIS — H2513 Age-related nuclear cataract, bilateral: Secondary | ICD-10-CM | POA: Diagnosis not present

## 2019-08-11 DIAGNOSIS — H401133 Primary open-angle glaucoma, bilateral, severe stage: Secondary | ICD-10-CM | POA: Diagnosis not present

## 2019-08-16 DIAGNOSIS — R69 Illness, unspecified: Secondary | ICD-10-CM | POA: Diagnosis not present

## 2019-09-06 MED FILL — DOVATO 50-300 MG TABS: 50-300 | 30 days supply | Qty: 30 | Fill #10

## 2019-10-05 MED FILL — DOVATO 50-300 MG TABS: 50-300 | 30 days supply | Qty: 30 | Fill #11

## 2019-11-01 ENCOUNTER — Other Ambulatory Visit: Payer: Self-pay | Admitting: Pharmacist

## 2019-11-01 DIAGNOSIS — B20 Human immunodeficiency virus [HIV] disease: Secondary | ICD-10-CM

## 2019-11-09 MED FILL — DOVATO 50-300 MG TABS: 50-300 | 30 days supply | Qty: 30 | Fill #0

## 2019-12-08 DIAGNOSIS — Z125 Encounter for screening for malignant neoplasm of prostate: Secondary | ICD-10-CM | POA: Diagnosis not present

## 2019-12-08 DIAGNOSIS — Z1211 Encounter for screening for malignant neoplasm of colon: Secondary | ICD-10-CM | POA: Diagnosis not present

## 2019-12-08 DIAGNOSIS — R945 Abnormal results of liver function studies: Secondary | ICD-10-CM | POA: Diagnosis not present

## 2019-12-08 DIAGNOSIS — Z6831 Body mass index (BMI) 31.0-31.9, adult: Secondary | ICD-10-CM | POA: Diagnosis not present

## 2019-12-08 DIAGNOSIS — N400 Enlarged prostate without lower urinary tract symptoms: Secondary | ICD-10-CM | POA: Diagnosis not present

## 2019-12-08 DIAGNOSIS — R69 Illness, unspecified: Secondary | ICD-10-CM | POA: Diagnosis not present

## 2019-12-08 DIAGNOSIS — Z0001 Encounter for general adult medical examination with abnormal findings: Secondary | ICD-10-CM | POA: Diagnosis not present

## 2019-12-08 DIAGNOSIS — N183 Chronic kidney disease, stage 3 unspecified: Secondary | ICD-10-CM | POA: Diagnosis not present

## 2019-12-08 DIAGNOSIS — E782 Mixed hyperlipidemia: Secondary | ICD-10-CM | POA: Diagnosis not present

## 2019-12-08 DIAGNOSIS — R7303 Prediabetes: Secondary | ICD-10-CM | POA: Diagnosis not present

## 2019-12-09 DIAGNOSIS — N183 Chronic kidney disease, stage 3 unspecified: Secondary | ICD-10-CM | POA: Diagnosis not present

## 2019-12-09 DIAGNOSIS — E782 Mixed hyperlipidemia: Secondary | ICD-10-CM | POA: Diagnosis not present

## 2019-12-09 DIAGNOSIS — R319 Hematuria, unspecified: Secondary | ICD-10-CM | POA: Diagnosis not present

## 2019-12-09 DIAGNOSIS — R7303 Prediabetes: Secondary | ICD-10-CM | POA: Diagnosis not present

## 2019-12-15 ENCOUNTER — Telehealth: Payer: Self-pay | Admitting: Pharmacy Technician

## 2019-12-15 ENCOUNTER — Other Ambulatory Visit: Payer: Self-pay

## 2019-12-15 ENCOUNTER — Other Ambulatory Visit (INDEPENDENT_AMBULATORY_CARE_PROVIDER_SITE_OTHER): Payer: Medicare HMO

## 2019-12-15 DIAGNOSIS — R7989 Other specified abnormal findings of blood chemistry: Secondary | ICD-10-CM

## 2019-12-15 DIAGNOSIS — H2513 Age-related nuclear cataract, bilateral: Secondary | ICD-10-CM | POA: Diagnosis not present

## 2019-12-15 DIAGNOSIS — H401133 Primary open-angle glaucoma, bilateral, severe stage: Secondary | ICD-10-CM | POA: Diagnosis not present

## 2019-12-15 LAB — HEPATIC FUNCTION PANEL
ALT: 15 U/L (ref 0–53)
AST: 19 U/L (ref 0–37)
Albumin: 4.5 g/dL (ref 3.5–5.2)
Alkaline Phosphatase: 75 U/L (ref 39–117)
Bilirubin, Direct: 0.1 mg/dL (ref 0.0–0.3)
Total Bilirubin: 0.4 mg/dL (ref 0.2–1.2)
Total Protein: 8.5 g/dL — ABNORMAL HIGH (ref 6.0–8.3)

## 2019-12-15 MED FILL — DOVATO 50-300 MG TABS: 50-300 | 30 days supply | Qty: 30 | Fill #1

## 2019-12-15 NOTE — Telephone Encounter (Signed)
[facility] Patient Advocate Encounter   I was successful in securing patient a $7500 grant from [employer] ([employer]) to provide copayment coverage for [name].  This will make the out of pocket cost $0.     I have spoken with the patient.    The billing information is as follows and has been shared with [facility].   RxBin: [id number] PCN:   [id number] Member ID: [id number] Group ID: [id number] Dates of Eligibility: 12/15/2019 through 12/14/2020  Patient knows to call the office with questions or concerns.  [name]. Dimas Aguas CPhT [facility] Patient [facility] for Infectious Disease Phone: [phone] Fax:  [phone]

## 2019-12-29 DIAGNOSIS — R319 Hematuria, unspecified: Secondary | ICD-10-CM | POA: Diagnosis not present

## 2019-12-29 DIAGNOSIS — N179 Acute kidney failure, unspecified: Secondary | ICD-10-CM | POA: Diagnosis not present

## 2019-12-29 DIAGNOSIS — R7301 Impaired fasting glucose: Secondary | ICD-10-CM | POA: Diagnosis not present

## 2020-01-10 MED FILL — DOVATO 50-300 MG TABS: 50-300 | 30 days supply | Qty: 30 | Fill #2

## 2020-01-17 NOTE — Addendum Note (Signed)
Addended by: Mariea Clonts D on: 01/17/2020 05:12 PM   Modules accepted: Orders

## 2020-01-18 DIAGNOSIS — N179 Acute kidney failure, unspecified: Secondary | ICD-10-CM | POA: Diagnosis not present

## 2020-01-19 ENCOUNTER — Other Ambulatory Visit (HOSPITAL_COMMUNITY)
Admission: RE | Admit: 2020-01-19 | Discharge: 2020-01-19 | Disposition: A | Discharge status: Home / Self Care | Payer: Medicare HMO | Source: Ambulatory Visit | Attending: Infectious Disease | Admitting: Infectious Disease

## 2020-01-19 ENCOUNTER — Other Ambulatory Visit: Payer: Self-pay

## 2020-01-19 ENCOUNTER — Other Ambulatory Visit: Payer: Medicare HMO

## 2020-01-19 DIAGNOSIS — Z79899 Other long term (current) drug therapy: Secondary | ICD-10-CM | POA: Diagnosis not present

## 2020-01-19 DIAGNOSIS — B20 Human immunodeficiency virus [HIV] disease: Secondary | ICD-10-CM

## 2020-01-19 DIAGNOSIS — R69 Illness, unspecified: Secondary | ICD-10-CM | POA: Diagnosis not present

## 2020-01-20 ENCOUNTER — Other Ambulatory Visit: Payer: Medicare HMO

## 2020-01-20 LAB — URINE CYTOLOGY ANCILLARY ONLY
Chlamydia: NEGATIVE
Comment: NEGATIVE
Comment: NORMAL
Neisseria Gonorrhea: NEGATIVE

## 2020-01-20 LAB — T-HELPER CELL (CD4) - (RCID CLINIC ONLY)
CD4 % Helper T Cell: 28 % — ABNORMAL LOW (ref 33–65)
CD4 T Cell Abs: 569 /uL (ref 400–1790)

## 2020-01-22 LAB — CBC
HCT: 45.2 % (ref 38.5–50.0)
Hemoglobin: 15 g/dL (ref 13.2–17.1)
MCH: 29.7 pg (ref 27.0–33.0)
MCHC: 33.2 g/dL (ref 32.0–36.0)
MCV: 89.5 fL (ref 80.0–100.0)
MPV: 9.3 fL (ref 7.5–12.5)
Platelets: 224 10*3/uL (ref 140–400)
RBC: 5.05 10*6/uL (ref 4.20–5.80)
RDW: 13.5 % (ref 11.0–15.0)
WBC: 6.1 10*3/uL (ref 3.8–10.8)

## 2020-01-22 LAB — COMPREHENSIVE METABOLIC PANEL
AG Ratio: 1.2 (calc) (ref 1.0–2.5)
ALT: 19 U/L (ref 9–46)
AST: 20 U/L (ref 10–35)
Albumin: 4.3 g/dL (ref 3.6–5.1)
Alkaline phosphatase (APISO): 77 U/L (ref 35–144)
BUN/Creatinine Ratio: 9 (calc) (ref 6–22)
BUN: 17 mg/dL (ref 7–25)
CO2: 26 mmol/L (ref 20–32)
Calcium: 9.9 mg/dL (ref 8.6–10.3)
Chloride: 107 mmol/L (ref 98–110)
Creat: 1.8 mg/dL — ABNORMAL HIGH (ref 0.70–1.18)
Globulin: 3.6 g/dL (calc) (ref 1.9–3.7)
Glucose, Bld: 89 mg/dL (ref 65–99)
Potassium: 5 mmol/L (ref 3.5–5.3)
Sodium: 139 mmol/L (ref 135–146)
Total Bilirubin: 0.4 mg/dL (ref 0.2–1.2)
Total Protein: 7.9 g/dL (ref 6.1–8.1)

## 2020-01-22 LAB — HIV-1 RNA QUANT-NO REFLEX-BLD
HIV 1 RNA Quant: <20 copies/mL — AB
HIV-1 RNA Quant, Log: <1.3 Log copies/mL — AB

## 2020-01-22 LAB — LIPID PANEL
Cholesterol: 121 mg/dL (ref <200)
HDL: 31 mg/dL — ABNORMAL LOW (ref >=40)
LDL Cholesterol (Calc): 72 mg/dL (calc)
Non-HDL Cholesterol (Calc): 90 mg/dL (calc) (ref <130)
Total CHOL/HDL Ratio: 3.9 (calc) (ref <5.0)
Triglycerides: 101 mg/dL (ref <150)

## 2020-01-22 LAB — RPR: RPR Ser Ql: NONREACTIVE

## 2020-01-24 ENCOUNTER — Other Ambulatory Visit: Payer: Self-pay | Admitting: Family Medicine

## 2020-01-24 DIAGNOSIS — R319 Hematuria, unspecified: Secondary | ICD-10-CM

## 2020-01-26 DIAGNOSIS — R972 Elevated prostate specific antigen [PSA]: Secondary | ICD-10-CM | POA: Diagnosis not present

## 2020-01-26 DIAGNOSIS — N400 Enlarged prostate without lower urinary tract symptoms: Secondary | ICD-10-CM | POA: Diagnosis not present

## 2020-01-28 ENCOUNTER — Ambulatory Visit
Admission: RE | Admit: 2020-01-28 | Discharge: 2020-01-28 | Disposition: A | Discharge status: Home / Self Care | Payer: Medicare HMO | Source: Ambulatory Visit | Attending: Family Medicine | Admitting: Family Medicine

## 2020-01-28 DIAGNOSIS — R319 Hematuria, unspecified: Secondary | ICD-10-CM

## 2020-01-28 DIAGNOSIS — N200 Calculus of kidney: Secondary | ICD-10-CM | POA: Diagnosis not present

## 2020-02-03 ENCOUNTER — Other Ambulatory Visit: Payer: Self-pay

## 2020-02-03 ENCOUNTER — Encounter: Payer: Medicare HMO | Admitting: Infectious Disease

## 2020-02-03 ENCOUNTER — Encounter: Payer: Self-pay | Admitting: Infectious Disease

## 2020-02-03 ENCOUNTER — Ambulatory Visit (INDEPENDENT_AMBULATORY_CARE_PROVIDER_SITE_OTHER): Payer: Medicare HMO | Admitting: Infectious Disease

## 2020-02-03 ENCOUNTER — Other Ambulatory Visit: Payer: Self-pay | Admitting: Infectious Disease

## 2020-02-03 DIAGNOSIS — E785 Hyperlipidemia, unspecified: Secondary | ICD-10-CM

## 2020-02-03 DIAGNOSIS — R69 Illness, unspecified: Secondary | ICD-10-CM | POA: Diagnosis not present

## 2020-02-03 DIAGNOSIS — B20 Human immunodeficiency virus [HIV] disease: Secondary | ICD-10-CM | POA: Diagnosis not present

## 2020-02-03 DIAGNOSIS — R972 Elevated prostate specific antigen [PSA]: Secondary | ICD-10-CM | POA: Diagnosis not present

## 2020-02-03 MED ORDER — DOVATO 50-300 MG PO TABS: 1.0000 | ORAL_TABLET | Freq: Every day | ORAL | 11 refills | Status: DC

## 2020-02-03 NOTE — Progress Notes (Signed)
Virtual Visit via Telephone Note  I connected with Jay Hoffman on 02/03/20 at  1:45 PM EST by telephone and verified that I am speaking with the correct person using two identifiers.  Location: Patient: Home Provider: [facility]   I discussed the limitations, risks, security and privacy concerns of performing an evaluation and management service by telephone and the availability of in person appointments. I also discussed with the patient that there may be a patient responsible charge related to this service. The patient expressed understanding and agreed to proceed.   History of Present Illness:  Jay Hoffman is a 74 year old African-American man living with HIV that has been perfectly suppressed recently on [name].  He does have comorbid glaucoma and hyperlipidemia as well as BPH.  His labs that were done recently showed him to have a perfectly suppressed viral load and healthy CD4 count.  Have a bump in his serum creatinine up to 1.8 and followed up with his primary care physician Jay Hoffman.  He has had repeat blood work with her but I do not have access to it.  He said he had an ultrasound done , which I would think would be to look for obstructive causes of his increase in creatinine especially given his BPH.  Me tell me the ultrasound looked "looked fine"  [facility]  Gets his medications from [employer] and they mail them to him.  12 point review of systems is as above otherwise negative   Observations/Objective: Jay Hoffman seems to being doing quite well with perfect adherence and virological suppression on [name]  Assessment and Plan:  HIV disease: Perfectly controlled on [name].  He can follow-up in 1 years time.  Elevation serum creatinine: Obstructive uropathy would make sense to me.  He is certainly not on any nephrotoxic drugs.  Note I would not dose adjust [name] if he had worsening renal function as I  think 3TC s safe even at the lowest GFR's.  Its renal dosing to me is based on antiquated studies and not consistent with what most experienced treaters practice  BPH continuing his finasteride  Glaucoma continuing topical drops.  Vaccination he had Covid vaccine #1 from [employer] and is having his second 1 in March    Follow Up Instructions:    I discussed the assessment and treatment plan with the patient. The patient was provided an opportunity to ask questions and all were answered. The patient agreed with the plan and demonstrated an understanding of the instructions.   The patient was advised to call back or seek an in-person evaluation if the symptoms worsen or if the condition fails to improve as anticipated.  I provided 14 minutes of non-face-to-face time during this encounter.   Acey Lav, MD

## 2020-02-07 MED FILL — DOVATO 50-300 MG TABS: 50-300 | 30 days supply | Qty: 30 | Fill #0

## 2020-03-02 MED FILL — DOVATO 50-300 MG TABS: 50-300 | 30 days supply | Qty: 30 | Fill #1

## 2020-03-22 DIAGNOSIS — N179 Acute kidney failure, unspecified: Secondary | ICD-10-CM | POA: Diagnosis not present

## 2020-03-22 DIAGNOSIS — R7301 Impaired fasting glucose: Secondary | ICD-10-CM | POA: Diagnosis not present

## 2020-04-03 MED FILL — DOVATO 50-300 MG TABS: 50-300 | 30 days supply | Qty: 30 | Fill #2

## 2020-04-14 DIAGNOSIS — H401133 Primary open-angle glaucoma, bilateral, severe stage: Secondary | ICD-10-CM | POA: Diagnosis not present

## 2020-04-14 DIAGNOSIS — H2513 Age-related nuclear cataract, bilateral: Secondary | ICD-10-CM | POA: Diagnosis not present

## 2020-05-04 DIAGNOSIS — E669 Obesity, unspecified: Secondary | ICD-10-CM | POA: Diagnosis not present

## 2020-05-04 DIAGNOSIS — E1169 Type 2 diabetes mellitus with other specified complication: Secondary | ICD-10-CM | POA: Diagnosis not present

## 2020-05-04 DIAGNOSIS — N183 Chronic kidney disease, stage 3 unspecified: Secondary | ICD-10-CM | POA: Diagnosis not present

## 2020-05-04 MED FILL — DOVATO 50-300 MG TABS: 50-300 | 30 days supply | Qty: 30 | Fill #3

## 2020-06-01 MED FILL — DOVATO 50-300 MG TABS: 50-300 | 30 days supply | Qty: 30 | Fill #4

## 2020-06-06 ENCOUNTER — Encounter: Payer: Self-pay | Admitting: Registered"

## 2020-06-06 ENCOUNTER — Encounter: Payer: Medicare HMO | Attending: Family Medicine | Admitting: Registered"

## 2020-06-06 ENCOUNTER — Other Ambulatory Visit: Payer: Self-pay

## 2020-06-06 DIAGNOSIS — E119 Type 2 diabetes mellitus without complications: Secondary | ICD-10-CM | POA: Insufficient documentation

## 2020-06-06 NOTE — Patient Instructions (Addendum)
Goals:  Follow Diabetes Meal Plan as instructed  Eat 3 meals and 2 snacks, every 3-5 hrs  Aim to have 1/2 plate of non-starchy vegetables + 1/4 plate protein + 14 plate starch/grain  Add lean protein foods to meals/snacks  Aim for 30 mins of physical activity most days/week

## 2020-06-06 NOTE — Progress Notes (Signed)
Diabetes Self-Management Education  Visit Type:  First/Initial  Appt. Start Time: 8:09 Appt. End Time: 9:01  06/06/2020  [name], identified by name and date of birth, is a 74 y.o. male with a diagnosis of Diabetes: Type 2.   ASSESSMENT  Pt expectations: wants to know what he needs to be eating  Previous labs reveal elevated A1c (6.6), Chol (121), decreased HDL (31), and Trig (101).  Pt arrives states he is trying to lose weight. Has reduced amount of food intake. States he has not been active lately and planning to get back into the gym. Reports having access to [employer] program at [employer].  Reports he and his wife prepare meals at home.   There were no vitals taken for this visit. There is no height or weight on file to calculate BMI.    Diabetes Self-Management Education - 06/06/20 0813      Health Coping   How would you rate your overall health? Good      Psychosocial Assessment   Patient Belief/Attitude about Diabetes Motivated to manage diabetes    Self-care barriers None    Self-management support Family;Doctor's office    Special Needs None    Preferred Learning Style No preference indicated    Learning Readiness Ready      Complications   Last HgB A1C per patient/outside source 6.6 %    How often do you check your blood sugar? 0 times/day (not testing)    Number of hypoglycemic episodes per month 0    Number of hyperglycemic episodes per week 0    Have you had a dilated eye exam in the past 12 months? Yes    Have you had a dental exam in the past 12 months? No    Are you checking your feet? No      Dietary Intake   Breakfast 8-9 am - coffee + [place] breakfast cake    Snack (morning) --    Lunch 11 am - bacon + boiled egg    Dinner 7 pm - chicken + pasta + stock (cooked in crock pot) + peach cobbler    Beverage(s) coffee, water (2*16 oz; 32 oz), Dr. Reino Kent      Exercise   Exercise Type ADL's    How many days per week to you exercise? 0     How many minutes per day do you exercise? 0    Total minutes per week of exercise 0      Patient Education   Previous Diabetes Education No    Disease state  Definition of diabetes, type 1 and 2, and the diagnosis of diabetes;Factors that contribute to the development of diabetes    Nutrition management  Role of diet in the treatment of diabetes and the relationship between the three main macronutrients and blood glucose level;Food label reading, portion sizes and measuring food.;Reviewed blood glucose goals for pre and post meals and how to evaluate the patients' food intake on their blood glucose level.;Effects of alcohol on blood glucose and safety factors with consumption of alcohol.;Meal options for control of blood glucose level and chronic complications.    Physical activity and exercise  Role of exercise on diabetes management, blood pressure control and cardiac health.    Monitoring Interpreting lab values - A1C, lipid, urine microalbumina.;Identified appropriate SMBG and/or A1C goals.;Daily foot exams;Yearly dilated eye exam    Acute complications Taught treatment of hypoglycemia - the 15 rule.;Discussed and identified patients' treatment of hyperglycemia.  Chronic complications Relationship between chronic complications and blood glucose control;Assessed and discussed foot care and prevention of foot problems;Lipid levels, blood glucose control and heart disease;Identified and discussed with patient  current chronic complications;Retinopathy and reason for yearly dilated eye exams;Nephropathy, what it is, prevention of, the use of ACE, ARB's and early detection of through urine microalbumia.;Reviewed with patient heart disease, higher risk of, and prevention;Applicable immunizations    Psychosocial adjustment Worked with patient to identify barriers to care and solutions;Helped patient identify a support system for diabetes management    Personal strategies to promote health Lifestyle  issues that need to be addressed for better diabetes care      Individualized Goals (developed by patient)   Nutrition General guidelines for healthy choices and portions discussed    Physical Activity 30 minutes per day;Exercise 3-5 times per week    Medications Not Applicable    Monitoring  Not Applicable    Reducing Risk do foot checks daily;treat hypoglycemia with 15 grams of carbs if blood glucose less than 70mg /dL      Post-Education Assessment   Patient understands the diabetes disease and treatment process. Demonstrates understanding / competency    Patient understands incorporating nutritional management into lifestyle. Demonstrates understanding / competency    Patient undertands incorporating physical activity into lifestyle. Demonstrates understanding / competency    Patient understands using medications safely. Demonstrates understanding / competency    Patient understands monitoring blood glucose, interpreting and using results Demonstrates understanding / competency    Patient understands prevention, detection, and treatment of acute complications. Demonstrates understanding / competency    Patient understands prevention, detection, and treatment of chronic complications. Demonstrates understanding / competency    Patient understands how to develop strategies to address psychosocial issues. Demonstrates understanding / competency    Patient understands how to develop strategies to promote health/change behavior. Demonstrates understanding / competency      Outcomes   Program Status Completed           Learning Objective:  Patient will have a greater understanding of diabetes self-management. Patient education plan is to attend individual and/or group sessions per assessed needs and concerns.   Plan:   Patient Instructions  Goals:  Follow Diabetes Meal Plan as instructed  Eat 3 meals and 2 snacks, every 3-5 hrs  Aim to have 1/2 plate of non-starchy vegetables +  1/4 plate protein + 14 plate starch/grain  Add lean protein foods to meals/snacks  Aim for 30 mins of physical activity most days/week     Expected Outcomes:  Demonstrated interest in learning. Expect positive outcomes  Education material provided: ADA - How to Thrive: A Guide for Your Journey with Diabetes  If problems or questions, patient to contact team via:  Phone and Email  Future DSME appointment: - PRN

## 2020-06-16 ENCOUNTER — Telehealth: Payer: Self-pay

## 2020-06-16 NOTE — Telephone Encounter (Signed)
-----   Message from Chrystie Nose, RN sent at 12/15/2019  1:50 PM EST ----- Regarding: labs Needs labs, order in epic

## 2020-06-16 NOTE — Telephone Encounter (Signed)
Spoke with patient to remind him that he is due for labs, pt advised that lab is open between 7:30 am- 5 pm, pt states that if he does not get by the lab today then he will go on Monday.

## 2020-06-19 ENCOUNTER — Other Ambulatory Visit: Payer: Self-pay

## 2020-06-19 ENCOUNTER — Other Ambulatory Visit (INDEPENDENT_AMBULATORY_CARE_PROVIDER_SITE_OTHER): Payer: Medicare HMO

## 2020-06-19 DIAGNOSIS — R7989 Other specified abnormal findings of blood chemistry: Secondary | ICD-10-CM

## 2020-06-19 LAB — HEPATIC FUNCTION PANEL
ALT: 10 U/L (ref 0–53)
AST: 14 U/L (ref 0–37)
Albumin: 4.1 g/dL (ref 3.5–5.2)
Alkaline Phosphatase: 68 U/L (ref 39–117)
Bilirubin, Direct: 0.1 mg/dL (ref 0.0–0.3)
Total Bilirubin: 0.3 mg/dL (ref 0.2–1.2)
Total Protein: 7.9 g/dL (ref 6.0–8.3)

## 2020-06-26 MED FILL — DOVATO 50-300 MG TABS: 50-300 | 30 days supply | Qty: 30 | Fill #5

## 2020-07-20 MED FILL — DOVATO 50-300 MG TABS: 50-300 | 30 days supply | Qty: 30 | Fill #6

## 2020-08-16 DIAGNOSIS — H524 Presbyopia: Secondary | ICD-10-CM | POA: Diagnosis not present

## 2020-08-16 DIAGNOSIS — H2513 Age-related nuclear cataract, bilateral: Secondary | ICD-10-CM | POA: Diagnosis not present

## 2020-08-16 DIAGNOSIS — H401133 Primary open-angle glaucoma, bilateral, severe stage: Secondary | ICD-10-CM | POA: Diagnosis not present

## 2020-08-21 MED FILL — DOVATO 50-300 MG TABS: 50-300 | 30 days supply | Qty: 30 | Fill #7

## 2020-09-25 MED FILL — DOVATO 50-300 MG TABS: 50-300 | 30 days supply | Qty: 30 | Fill #8

## 2020-10-19 MED FILL — DOVATO 50-300 MG TABS: 50-300 | 30 days supply | Qty: 30 | Fill #9

## 2020-11-20 MED FILL — DOVATO 50-300 MG TABS: 50-300 | 30 days supply | Qty: 30 | Fill #10

## 2020-12-14 ENCOUNTER — Other Ambulatory Visit (INDEPENDENT_AMBULATORY_CARE_PROVIDER_SITE_OTHER): Payer: Medicare HMO

## 2020-12-14 DIAGNOSIS — R7989 Other specified abnormal findings of blood chemistry: Secondary | ICD-10-CM | POA: Diagnosis not present

## 2020-12-14 LAB — HEPATIC FUNCTION PANEL
ALT: 10 U/L (ref 0–53)
AST: 15 U/L (ref 0–37)
Albumin: 4.3 g/dL (ref 3.5–5.2)
Alkaline Phosphatase: 73 U/L (ref 39–117)
Bilirubin, Direct: 0.1 mg/dL (ref 0.0–0.3)
Total Bilirubin: 0.3 mg/dL (ref 0.2–1.2)
Total Protein: 8.3 g/dL (ref 6.0–8.3)

## 2020-12-20 DIAGNOSIS — H401133 Primary open-angle glaucoma, bilateral, severe stage: Secondary | ICD-10-CM | POA: Diagnosis not present

## 2020-12-20 DIAGNOSIS — H2513 Age-related nuclear cataract, bilateral: Secondary | ICD-10-CM | POA: Diagnosis not present

## 2020-12-26 ENCOUNTER — Telehealth: Payer: Self-pay

## 2020-12-26 MED FILL — DOVATO 50-300 MG TABS: 50-300 | 30 days supply | Qty: 30 | Fill #11

## 2020-12-26 NOTE — Telephone Encounter (Signed)
[facility] Patient Advocate Encounter   I was successful in securing patient a $7500 grant from [employer] ([employer]) to provide copayment coverage for [name].  This will make the out of pocket cost $0.00.     I have spoken with the patient.    The billing information is as follows and has been shared with [facility].   Patient: Jay Hoffman Fund: HIV, AIDS, and Prevention Award Period: - 12/26/2021 Cardholder: [id number] BIN: [id number] PCN: [id number] Group: [id number] For pharmacy inquiries, contact PDMI at [phone]. For patient inquiries, contact PAF at [phone].        Patient knows to call the office with questions or concerns.  Clearance Coots, CPhT [facility] Phone: [phone] Fax:  [phone]

## 2021-01-18 DIAGNOSIS — E782 Mixed hyperlipidemia: Secondary | ICD-10-CM | POA: Diagnosis not present

## 2021-01-18 DIAGNOSIS — E669 Obesity, unspecified: Secondary | ICD-10-CM | POA: Diagnosis not present

## 2021-01-18 DIAGNOSIS — Z0001 Encounter for general adult medical examination with abnormal findings: Secondary | ICD-10-CM | POA: Diagnosis not present

## 2021-01-18 DIAGNOSIS — B20 Human immunodeficiency virus [HIV] disease: Secondary | ICD-10-CM | POA: Diagnosis not present

## 2021-01-18 DIAGNOSIS — R69 Illness, unspecified: Secondary | ICD-10-CM | POA: Diagnosis not present

## 2021-01-18 DIAGNOSIS — R7303 Prediabetes: Secondary | ICD-10-CM | POA: Diagnosis not present

## 2021-01-18 DIAGNOSIS — N183 Chronic kidney disease, stage 3 unspecified: Secondary | ICD-10-CM | POA: Diagnosis not present

## 2021-01-18 DIAGNOSIS — R319 Hematuria, unspecified: Secondary | ICD-10-CM | POA: Diagnosis not present

## 2021-01-18 DIAGNOSIS — R945 Abnormal results of liver function studies: Secondary | ICD-10-CM | POA: Diagnosis not present

## 2021-01-18 DIAGNOSIS — N400 Enlarged prostate without lower urinary tract symptoms: Secondary | ICD-10-CM | POA: Diagnosis not present

## 2021-01-18 DIAGNOSIS — H401133 Primary open-angle glaucoma, bilateral, severe stage: Secondary | ICD-10-CM | POA: Diagnosis not present

## 2021-01-22 ENCOUNTER — Other Ambulatory Visit: Payer: Self-pay | Admitting: Infectious Disease

## 2021-01-22 DIAGNOSIS — B20 Human immunodeficiency virus [HIV] disease: Secondary | ICD-10-CM

## 2021-01-22 MED FILL — DOVATO 50-300 MG TABS: 50-300 | 30 days supply | Qty: 30 | Fill #0

## 2021-02-05 ENCOUNTER — Other Ambulatory Visit: Payer: Medicare HMO

## 2021-02-05 ENCOUNTER — Other Ambulatory Visit: Payer: Self-pay

## 2021-02-05 DIAGNOSIS — R69 Illness, unspecified: Secondary | ICD-10-CM | POA: Diagnosis not present

## 2021-02-05 DIAGNOSIS — B20 Human immunodeficiency virus [HIV] disease: Secondary | ICD-10-CM

## 2021-02-05 DIAGNOSIS — N179 Acute kidney failure, unspecified: Secondary | ICD-10-CM | POA: Diagnosis not present

## 2021-02-05 DIAGNOSIS — Z79899 Other long term (current) drug therapy: Secondary | ICD-10-CM | POA: Diagnosis not present

## 2021-02-06 LAB — T-HELPER CELL (CD4) - (RCID CLINIC ONLY)
CD4 % Helper T Cell: 19 % — ABNORMAL LOW (ref 33–65)
CD4 T Cell Abs: 398 /uL — ABNORMAL LOW (ref 400–1790)

## 2021-02-07 LAB — COMPLETE METABOLIC PANEL WITH GFR
AG Ratio: 1.2 (calc) (ref 1.0–2.5)
ALT: 11 U/L (ref 9–46)
AST: 17 U/L (ref 10–35)
Albumin: 4.2 g/dL (ref 3.6–5.1)
Alkaline phosphatase (APISO): 83 U/L (ref 35–144)
BUN/Creatinine Ratio: 9 (calc) (ref 6–22)
BUN: 15 mg/dL (ref 7–25)
CO2: 26 mmol/L (ref 20–32)
Calcium: 9.7 mg/dL (ref 8.6–10.3)
Chloride: 105 mmol/L (ref 98–110)
Creat: 1.65 mg/dL — ABNORMAL HIGH (ref 0.70–1.18)
GFR, Est African American: 47 mL/min/{1.73_m2} — ABNORMAL LOW (ref >=60)
GFR, Est Non African American: 40 mL/min/{1.73_m2} — ABNORMAL LOW (ref >=60)
Globulin: 3.5 g/dL (calc) (ref 1.9–3.7)
Glucose, Bld: 99 mg/dL (ref 65–99)
Potassium: 4.7 mmol/L (ref 3.5–5.3)
Sodium: 137 mmol/L (ref 135–146)
Total Bilirubin: 0.5 mg/dL (ref 0.2–1.2)
Total Protein: 7.7 g/dL (ref 6.1–8.1)

## 2021-02-07 LAB — CBC WITH DIFFERENTIAL/PLATELET
Absolute Monocytes: 402 cells/uL (ref 200–950)
Basophils Absolute: 40 cells/uL (ref 0–200)
Basophils Relative: 0.6 %
Eosinophils Absolute: 60 cells/uL (ref 15–500)
Eosinophils Relative: 0.9 %
HCT: 45 % (ref 38.5–50.0)
Hemoglobin: 15.3 g/dL (ref 13.2–17.1)
Lymphs Abs: 2412 cells/uL (ref 850–3900)
MCH: 30.4 pg (ref 27.0–33.0)
MCHC: 34 g/dL (ref 32.0–36.0)
MCV: 89.5 fL (ref 80.0–100.0)
MPV: 9.4 fL (ref 7.5–12.5)
Monocytes Relative: 6 %
Neutro Abs: 3786 cells/uL (ref 1500–7800)
Neutrophils Relative %: 56.5 %
Platelets: 257 10*3/uL (ref 140–400)
RBC: 5.03 10*6/uL (ref 4.20–5.80)
RDW: 13.2 % (ref 11.0–15.0)
Total Lymphocyte: 36 %
WBC: 6.7 10*3/uL (ref 3.8–10.8)

## 2021-02-07 LAB — FLUORESCENT TREPONEMAL AB(FTA)-IGG-BLD: Fluorescent Treponemal ABS: NONREACTIVE

## 2021-02-07 LAB — LIPID PANEL
Cholesterol: 141 mg/dL (ref <200)
HDL: 34 mg/dL — ABNORMAL LOW (ref >=40)
LDL Cholesterol (Calc): 85 mg/dL (calc)
Non-HDL Cholesterol (Calc): 107 mg/dL (calc) (ref <130)
Total CHOL/HDL Ratio: 4.1 (calc) (ref <5.0)
Triglycerides: 128 mg/dL (ref <150)

## 2021-02-07 LAB — RPR TITER: RPR Titer: 1:1 {titer} — ABNORMAL HIGH

## 2021-02-07 LAB — RPR: RPR Ser Ql: REACTIVE — AB

## 2021-02-07 LAB — HIV-1 RNA QUANT-NO REFLEX-BLD
HIV 1 RNA Quant: 31 Copies/mL — ABNORMAL HIGH
HIV-1 RNA Quant, Log: 1.5 Log cps/mL — ABNORMAL HIGH

## 2021-02-19 MED FILL — DOVATO 50-300 MG TABS: 50-300 | 30 days supply | Qty: 30 | Fill #1

## 2021-02-20 DIAGNOSIS — R972 Elevated prostate specific antigen [PSA]: Secondary | ICD-10-CM | POA: Diagnosis not present

## 2021-02-27 ENCOUNTER — Other Ambulatory Visit (HOSPITAL_COMMUNITY): Payer: Self-pay

## 2021-02-27 DIAGNOSIS — R972 Elevated prostate specific antigen [PSA]: Secondary | ICD-10-CM | POA: Diagnosis not present

## 2021-02-27 DIAGNOSIS — N400 Enlarged prostate without lower urinary tract symptoms: Secondary | ICD-10-CM | POA: Diagnosis not present

## 2021-03-06 ENCOUNTER — Other Ambulatory Visit (HOSPITAL_COMMUNITY): Payer: Self-pay

## 2021-03-06 ENCOUNTER — Encounter: Payer: Self-pay | Admitting: Infectious Disease

## 2021-03-06 ENCOUNTER — Ambulatory Visit (INDEPENDENT_AMBULATORY_CARE_PROVIDER_SITE_OTHER): Payer: Medicare HMO | Admitting: Infectious Disease

## 2021-03-06 ENCOUNTER — Other Ambulatory Visit: Payer: Self-pay

## 2021-03-06 VITALS — BP 128/81 | HR 69 | Temp 98.2°F | Wt 210.0 lb

## 2021-03-06 DIAGNOSIS — I10 Essential (primary) hypertension: Secondary | ICD-10-CM

## 2021-03-06 DIAGNOSIS — B20 Human immunodeficiency virus [HIV] disease: Secondary | ICD-10-CM

## 2021-03-06 DIAGNOSIS — N401 Enlarged prostate with lower urinary tract symptoms: Secondary | ICD-10-CM

## 2021-03-06 DIAGNOSIS — N183 Chronic kidney disease, stage 3 unspecified: Secondary | ICD-10-CM

## 2021-03-06 DIAGNOSIS — R69 Illness, unspecified: Secondary | ICD-10-CM | POA: Diagnosis not present

## 2021-03-06 MED ORDER — DOLUTEGRAVIR-LAMIVUDINE 50-300 MG PO TABS
1.0000 | ORAL_TABLET | Freq: Every day | ORAL | 11 refills | Status: DC
  Filled 2021-03-06: qty 30, fill #0
  Filled 2021-03-20: qty 30, 30d supply, fill #0
  Filled 2021-04-24: qty 30, 30d supply, fill #1
  Filled 2021-05-15: qty 30, 30d supply, fill #2
  Filled 2021-06-12: qty 30, 30d supply, fill #3
  Filled 2021-07-19: qty 30, 30d supply, fill #4
  Filled 2021-08-14: qty 30, 30d supply, fill #5
  Filled 2021-09-18: qty 30, 30d supply, fill #6
  Filled 2021-10-24: qty 30, 30d supply, fill #7
  Filled 2021-11-21: qty 30, 30d supply, fill #8
  Filled 2021-12-24: qty 30, 30d supply, fill #9

## 2021-03-06 NOTE — Progress Notes (Signed)
Subjective:  Chief complaint follow-up for HIV disease on medications  Patient ID: Jay Hoffman, male    DOB: 1946/09/02, 75 y.o.   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  He does have comorbid hypertension chronic kidney disease BPH and hyperlipidemia.  He is followed by Dr. Cam Hai for primary care also with urology and gastroenterology.  He is supposed to be seen by nephrology soon.     Past Medical History:  Diagnosis Date  . Acute cholecystitis   . BPH (benign prostatic hyperplasia)   . Cataract   . Cryptococcosis (HCC)   . Diabetes mellitus without complication (HCC)   . GERD (gastroesophageal reflux disease)   . Glaucoma   . Human immunodeficiency virus (HIV) disease (HCC)   . Other and unspecified hyperlipidemia   . Pneumocystosis (HCC)   . Problems related to high-risk sexual behavior   . Unspecified personal history presenting hazards to health     Past Surgical History:  Procedure Laterality Date  . CHOLECYSTECTOMY    . ERCP      Family History  Problem Relation Age of Onset  . Esophageal cancer Father   . Kidney disease Sister   . Diabetes Paternal Aunt   . Cancer Other   . Colon cancer Neg Hx   . Rectal cancer Neg Hx   . Stomach cancer Neg Hx       Social History   Socioeconomic History  . Marital status: Married    Spouse name: Not on file  . Number of children: 2  . Years of education: Not on file  . Highest education level: Not on file  Occupational History  . Occupation: retired IT sales professional  Tobacco Use  . Smoking status: Never Smoker  . Smokeless tobacco: Never Used  Substance and Sexual Activity  . Alcohol use: Yes    Comment: occasional   Beer  . Drug use: No  . Sexual activity: Yes    Partners: Female    Comment: pt. given condoms  Other Topics Concern  . Not on file  Social History Narrative   Daily caffeine   Social Determinants of  Health   Financial Resource Strain: Not on file  Food Insecurity: Not on file  Transportation Needs: Not on file  Physical Activity: Not on file  Stress: Not on file  Social Connections: Not on file    No Known Allergies   Current Outpatient Medications:  .  dorzolamide-timolol (COSOPT) 22.3-6.8 MG/ML ophthalmic solution, , Disp: , Rfl:  .  finasteride (PROSCAR) 5 MG tablet, Take 5 mg by mouth daily., Disp: , Rfl:  .  pravastatin (PRAVACHOL) 80 MG tablet, , Disp: , Rfl: 5 .  PRESCRIPTION MEDICATION, 3 prescription eye gtts for glaucoma, Disp: , Rfl:  .  pseudoephedrine-guaifenesin (MUCINEX D) 60-600 MG 12 hr tablet, Take 1 tablet by mouth every 12 (twelve) hours., Disp: , Rfl:  .  sildenafil (VIAGRA) 50 MG tablet, Take one half to one whole one half hour before sexual activity, Disp: 20 tablet, Rfl: 4 .  Dolutegravir-lamiVUDine 50-300 MG TABS, Take 1 tablet by mouth daily., Disp: 30 tablet, Rfl: 11  Current Facility-Administered Medications:  .  0.9 %  sodium chloride infusion, 500 mL, Intravenous, Once, Hilarie Fredrickson, MD   Review of Systems  Constitutional: Negative for activity change, appetite change, chills, diaphoresis, fatigue, fever and unexpected weight change.  HENT: Negative for  congestion, rhinorrhea, sinus pressure, sneezing, sore throat and trouble swallowing.   Eyes: Negative for photophobia and visual disturbance.  Respiratory: Negative for cough, chest tightness, shortness of breath, wheezing and stridor.   Cardiovascular: Negative for chest pain, palpitations and leg swelling.  Gastrointestinal: Negative for abdominal distention, abdominal pain, anal bleeding, blood in stool, constipation, diarrhea, nausea and vomiting.  Genitourinary: Negative for difficulty urinating, dysuria, flank pain and hematuria.  Musculoskeletal: Negative for arthralgias, back pain, gait problem, joint swelling and myalgias.  Skin: Negative for color change, pallor, rash and wound.   Neurological: Negative for dizziness, tremors, weakness and light-headedness.  Hematological: Negative for adenopathy. Does not bruise/bleed easily.  Psychiatric/Behavioral: Negative for agitation, behavioral problems, confusion, decreased concentration, dysphoric mood and sleep disturbance.       Objective:   Physical Exam Constitutional:      Appearance: He is well-developed.  HENT:     Head: Normocephalic and atraumatic.  Eyes:     Conjunctiva/sclera: Conjunctivae normal.  Cardiovascular:     Rate and Rhythm: Normal rate and regular rhythm.  Pulmonary:     Effort: Pulmonary effort is normal. No respiratory distress.     Breath sounds: No wheezing.  Abdominal:     General: There is no distension.     Palpations: Abdomen is soft.  Musculoskeletal:        General: No tenderness. Normal range of motion.     Cervical back: Normal range of motion and neck supple.  Skin:    General: Skin is warm and dry.     Coloration: Skin is not pale.     Findings: No erythema or rash.  Neurological:     General: No focal deficit present.     Mental Status: He is alert and oriented to person, place, and time. Mental status is at baseline.  Psychiatric:        Mood and Affect: Mood normal.        Behavior: Behavior normal.        Thought Content: Thought content normal.        Judgment: Judgment normal.           Assessment & Plan:  HIV disease: Continue [name] return to clinic in 1 years time.  Chronic kidney disease: Creatinine seems a bit worse now but he will be following up with nephrology soon.  BPH following with urology.  Hyperlipidemia managed by Dr. Sherryll Burger.  COVID prevention he has had 3 vaccines and have encouraged him to get a fourth dose now that he is more than 5 months from his last one.  I spent greater than 40 minutes with the patient including greater than 50% of time in face to face counsel of the patient reviewing laboratory data and in coordination of his  care.

## 2021-03-20 ENCOUNTER — Other Ambulatory Visit (HOSPITAL_COMMUNITY): Payer: Self-pay

## 2021-03-22 ENCOUNTER — Other Ambulatory Visit (HOSPITAL_COMMUNITY): Payer: Self-pay

## 2021-04-16 DIAGNOSIS — H401133 Primary open-angle glaucoma, bilateral, severe stage: Secondary | ICD-10-CM | POA: Diagnosis not present

## 2021-04-16 DIAGNOSIS — H2513 Age-related nuclear cataract, bilateral: Secondary | ICD-10-CM | POA: Diagnosis not present

## 2021-04-18 DIAGNOSIS — R69 Illness, unspecified: Secondary | ICD-10-CM | POA: Diagnosis not present

## 2021-04-18 DIAGNOSIS — N1831 Chronic kidney disease, stage 3a: Secondary | ICD-10-CM | POA: Diagnosis not present

## 2021-04-18 DIAGNOSIS — E785 Hyperlipidemia, unspecified: Secondary | ICD-10-CM | POA: Diagnosis not present

## 2021-04-18 DIAGNOSIS — R809 Proteinuria, unspecified: Secondary | ICD-10-CM | POA: Diagnosis not present

## 2021-04-18 DIAGNOSIS — I129 Hypertensive chronic kidney disease with stage 1 through stage 4 chronic kidney disease, or unspecified chronic kidney disease: Secondary | ICD-10-CM | POA: Diagnosis not present

## 2021-04-18 DIAGNOSIS — D631 Anemia in chronic kidney disease: Secondary | ICD-10-CM | POA: Diagnosis not present

## 2021-04-18 DIAGNOSIS — N2581 Secondary hyperparathyroidism of renal origin: Secondary | ICD-10-CM | POA: Diagnosis not present

## 2021-04-18 DIAGNOSIS — N200 Calculus of kidney: Secondary | ICD-10-CM | POA: Diagnosis not present

## 2021-04-19 ENCOUNTER — Other Ambulatory Visit: Payer: Self-pay | Admitting: Nephrology

## 2021-04-19 DIAGNOSIS — N1831 Chronic kidney disease, stage 3a: Secondary | ICD-10-CM

## 2021-04-20 ENCOUNTER — Other Ambulatory Visit (HOSPITAL_COMMUNITY): Payer: Self-pay

## 2021-04-24 ENCOUNTER — Other Ambulatory Visit (HOSPITAL_COMMUNITY): Payer: Self-pay

## 2021-04-26 ENCOUNTER — Ambulatory Visit
Admission: RE | Admit: 2021-04-26 | Discharge: 2021-04-26 | Disposition: A | Discharge status: Home / Self Care | Payer: Medicare HMO | Source: Ambulatory Visit | Attending: Nephrology | Admitting: Nephrology

## 2021-04-26 DIAGNOSIS — N400 Enlarged prostate without lower urinary tract symptoms: Secondary | ICD-10-CM | POA: Diagnosis not present

## 2021-04-26 DIAGNOSIS — N200 Calculus of kidney: Secondary | ICD-10-CM | POA: Diagnosis not present

## 2021-04-26 DIAGNOSIS — N1831 Chronic kidney disease, stage 3a: Secondary | ICD-10-CM

## 2021-04-26 DIAGNOSIS — N189 Chronic kidney disease, unspecified: Secondary | ICD-10-CM | POA: Diagnosis not present

## 2021-05-15 ENCOUNTER — Other Ambulatory Visit (HOSPITAL_COMMUNITY): Payer: Self-pay

## 2021-05-22 ENCOUNTER — Other Ambulatory Visit (HOSPITAL_COMMUNITY): Payer: Self-pay

## 2021-06-12 ENCOUNTER — Other Ambulatory Visit (HOSPITAL_COMMUNITY): Payer: Self-pay

## 2021-06-13 ENCOUNTER — Other Ambulatory Visit: Payer: Self-pay

## 2021-06-13 ENCOUNTER — Other Ambulatory Visit (INDEPENDENT_AMBULATORY_CARE_PROVIDER_SITE_OTHER): Payer: Medicare HMO

## 2021-06-13 DIAGNOSIS — R7989 Other specified abnormal findings of blood chemistry: Secondary | ICD-10-CM

## 2021-06-13 LAB — HEPATIC FUNCTION PANEL
ALT: 13 U/L (ref 0–53)
AST: 17 U/L (ref 0–37)
Albumin: 4.1 g/dL (ref 3.5–5.2)
Alkaline Phosphatase: 71 U/L (ref 39–117)
Bilirubin, Direct: 0.1 mg/dL (ref 0.0–0.3)
Total Bilirubin: 0.4 mg/dL (ref 0.2–1.2)
Total Protein: 7.9 g/dL (ref 6.0–8.3)

## 2021-06-18 ENCOUNTER — Other Ambulatory Visit (HOSPITAL_COMMUNITY): Payer: Self-pay

## 2021-07-10 ENCOUNTER — Other Ambulatory Visit (HOSPITAL_COMMUNITY): Payer: Self-pay

## 2021-07-19 ENCOUNTER — Other Ambulatory Visit (HOSPITAL_COMMUNITY): Payer: Self-pay

## 2021-08-14 ENCOUNTER — Other Ambulatory Visit (HOSPITAL_COMMUNITY): Payer: Self-pay

## 2021-08-21 DIAGNOSIS — R972 Elevated prostate specific antigen [PSA]: Secondary | ICD-10-CM | POA: Diagnosis not present

## 2021-08-23 ENCOUNTER — Other Ambulatory Visit (HOSPITAL_COMMUNITY): Payer: Self-pay

## 2021-08-28 DIAGNOSIS — N400 Enlarged prostate without lower urinary tract symptoms: Secondary | ICD-10-CM | POA: Diagnosis not present

## 2021-08-28 DIAGNOSIS — R3121 Asymptomatic microscopic hematuria: Secondary | ICD-10-CM | POA: Diagnosis not present

## 2021-08-28 DIAGNOSIS — R972 Elevated prostate specific antigen [PSA]: Secondary | ICD-10-CM | POA: Diagnosis not present

## 2021-08-29 DIAGNOSIS — I129 Hypertensive chronic kidney disease with stage 1 through stage 4 chronic kidney disease, or unspecified chronic kidney disease: Secondary | ICD-10-CM | POA: Diagnosis not present

## 2021-08-29 DIAGNOSIS — N1831 Chronic kidney disease, stage 3a: Secondary | ICD-10-CM | POA: Diagnosis not present

## 2021-08-29 DIAGNOSIS — R809 Proteinuria, unspecified: Secondary | ICD-10-CM | POA: Diagnosis not present

## 2021-08-29 DIAGNOSIS — N200 Calculus of kidney: Secondary | ICD-10-CM | POA: Diagnosis not present

## 2021-08-29 DIAGNOSIS — N2581 Secondary hyperparathyroidism of renal origin: Secondary | ICD-10-CM | POA: Diagnosis not present

## 2021-08-29 DIAGNOSIS — D631 Anemia in chronic kidney disease: Secondary | ICD-10-CM | POA: Diagnosis not present

## 2021-09-04 DIAGNOSIS — D7389 Other diseases of spleen: Secondary | ICD-10-CM | POA: Diagnosis not present

## 2021-09-04 DIAGNOSIS — N400 Enlarged prostate without lower urinary tract symptoms: Secondary | ICD-10-CM | POA: Diagnosis not present

## 2021-09-04 DIAGNOSIS — R3121 Asymptomatic microscopic hematuria: Secondary | ICD-10-CM | POA: Diagnosis not present

## 2021-09-04 DIAGNOSIS — N3289 Other specified disorders of bladder: Secondary | ICD-10-CM | POA: Diagnosis not present

## 2021-09-04 DIAGNOSIS — N200 Calculus of kidney: Secondary | ICD-10-CM | POA: Diagnosis not present

## 2021-09-10 DIAGNOSIS — H401133 Primary open-angle glaucoma, bilateral, severe stage: Secondary | ICD-10-CM | POA: Diagnosis not present

## 2021-09-10 DIAGNOSIS — H2513 Age-related nuclear cataract, bilateral: Secondary | ICD-10-CM | POA: Diagnosis not present

## 2021-09-18 ENCOUNTER — Other Ambulatory Visit (HOSPITAL_COMMUNITY): Payer: Self-pay

## 2021-09-24 ENCOUNTER — Other Ambulatory Visit (HOSPITAL_COMMUNITY): Payer: Self-pay

## 2021-09-25 ENCOUNTER — Other Ambulatory Visit (HOSPITAL_COMMUNITY): Payer: Self-pay

## 2021-10-22 ENCOUNTER — Other Ambulatory Visit (HOSPITAL_COMMUNITY): Payer: Self-pay

## 2021-10-24 ENCOUNTER — Other Ambulatory Visit (HOSPITAL_COMMUNITY): Payer: Self-pay

## 2021-10-30 ENCOUNTER — Other Ambulatory Visit (HOSPITAL_COMMUNITY): Payer: Self-pay

## 2021-11-01 DIAGNOSIS — R3121 Asymptomatic microscopic hematuria: Secondary | ICD-10-CM | POA: Diagnosis not present

## 2021-11-08 ENCOUNTER — Other Ambulatory Visit: Payer: Self-pay | Admitting: Urology

## 2021-11-14 ENCOUNTER — Other Ambulatory Visit: Payer: Self-pay | Admitting: Urology

## 2021-11-14 DIAGNOSIS — R972 Elevated prostate specific antigen [PSA]: Secondary | ICD-10-CM

## 2021-11-21 ENCOUNTER — Other Ambulatory Visit (HOSPITAL_COMMUNITY): Payer: Self-pay

## 2021-11-27 ENCOUNTER — Other Ambulatory Visit (HOSPITAL_COMMUNITY): Payer: Self-pay

## 2021-12-20 ENCOUNTER — Other Ambulatory Visit: Payer: Self-pay | Admitting: Infectious Disease

## 2021-12-20 DIAGNOSIS — B20 Human immunodeficiency virus [HIV] disease: Secondary | ICD-10-CM

## 2021-12-20 NOTE — Addendum Note (Signed)
Addended by: Harley Alto on: 12/20/2021 01:59 PM   Modules accepted: Orders

## 2021-12-21 ENCOUNTER — Other Ambulatory Visit: Payer: Self-pay | Admitting: Infectious Disease

## 2021-12-21 ENCOUNTER — Other Ambulatory Visit: Payer: Medicare HMO

## 2021-12-21 ENCOUNTER — Other Ambulatory Visit (HOSPITAL_COMMUNITY)
Admission: RE | Admit: 2021-12-21 | Discharge: 2021-12-21 | Disposition: A | Discharge status: Home / Self Care | Payer: Medicare HMO | Source: Ambulatory Visit | Attending: Infectious Disease | Admitting: Infectious Disease

## 2021-12-21 ENCOUNTER — Other Ambulatory Visit: Payer: Self-pay

## 2021-12-21 DIAGNOSIS — B20 Human immunodeficiency virus [HIV] disease: Secondary | ICD-10-CM

## 2021-12-21 DIAGNOSIS — R69 Illness, unspecified: Secondary | ICD-10-CM | POA: Diagnosis not present

## 2021-12-21 DIAGNOSIS — Z79899 Other long term (current) drug therapy: Secondary | ICD-10-CM | POA: Diagnosis not present

## 2021-12-21 NOTE — Addendum Note (Signed)
Addended by: Harley Alto on: 12/21/2021 08:10 AM   Modules accepted: Orders

## 2021-12-24 ENCOUNTER — Other Ambulatory Visit (HOSPITAL_COMMUNITY): Payer: Self-pay

## 2021-12-24 LAB — CBC WITH DIFFERENTIAL/PLATELET
Absolute Monocytes: 517 cells/uL (ref 200–950)
Basophils Absolute: 30 cells/uL (ref 0–200)
Basophils Relative: 0.4 %
Eosinophils Absolute: 53 cells/uL (ref 15–500)
Eosinophils Relative: 0.7 %
HCT: 44.3 % (ref 38.5–50.0)
Hemoglobin: 14.6 g/dL (ref 13.2–17.1)
Lymphs Abs: 2090 cells/uL (ref 850–3900)
MCH: 29.8 pg (ref 27.0–33.0)
MCHC: 33 g/dL (ref 32.0–36.0)
MCV: 90.4 fL (ref 80.0–100.0)
MPV: 9.2 fL (ref 7.5–12.5)
Monocytes Relative: 6.8 %
Neutro Abs: 4910 cells/uL (ref 1500–7800)
Neutrophils Relative %: 64.6 %
Platelets: 242 10*3/uL (ref 140–400)
RBC: 4.9 10*6/uL (ref 4.20–5.80)
RDW: 13.2 % (ref 11.0–15.0)
Total Lymphocyte: 27.5 %
WBC: 7.6 10*3/uL (ref 3.8–10.8)

## 2021-12-24 LAB — LIPID PANEL
Cholesterol: 143 mg/dL (ref <200)
HDL: 34 mg/dL — ABNORMAL LOW (ref >=40)
LDL Cholesterol (Calc): 85 mg/dL (calc)
Non-HDL Cholesterol (Calc): 109 mg/dL (calc) (ref <130)
Total CHOL/HDL Ratio: 4.2 (calc) (ref <5.0)
Triglycerides: 140 mg/dL (ref <150)

## 2021-12-24 LAB — COMPLETE METABOLIC PANEL WITH GFR
AG Ratio: 1.1 (calc) (ref 1.0–2.5)
ALT: 12 U/L (ref 9–46)
AST: 15 U/L (ref 10–35)
Albumin: 4.2 g/dL (ref 3.6–5.1)
Alkaline phosphatase (APISO): 78 U/L (ref 35–144)
BUN/Creatinine Ratio: 12 (calc) (ref 6–22)
BUN: 21 mg/dL (ref 7–25)
CO2: 24 mmol/L (ref 20–32)
Calcium: 9.5 mg/dL (ref 8.6–10.3)
Chloride: 108 mmol/L (ref 98–110)
Creat: 1.72 mg/dL — ABNORMAL HIGH (ref 0.70–1.28)
Globulin: 3.8 g/dL (calc) — ABNORMAL HIGH (ref 1.9–3.7)
Glucose, Bld: 111 mg/dL — ABNORMAL HIGH (ref 65–99)
Potassium: 4.2 mmol/L (ref 3.5–5.3)
Sodium: 139 mmol/L (ref 135–146)
Total Bilirubin: 0.5 mg/dL (ref 0.2–1.2)
Total Protein: 8 g/dL (ref 6.1–8.1)
eGFR: 41 mL/min/{1.73_m2} — ABNORMAL LOW (ref >=60)

## 2021-12-24 LAB — T-HELPER CELLS (CD4) COUNT (NOT AT ARMC)
Absolute CD4: 424 cells/uL — ABNORMAL LOW (ref 490–1740)
CD4 T Helper %: 22 % — ABNORMAL LOW (ref 30–61)
Total lymphocyte count: 1932 cells/uL (ref 850–3900)

## 2021-12-24 LAB — URINE CYTOLOGY ANCILLARY ONLY
Chlamydia: NEGATIVE
Comment: NEGATIVE
Comment: NORMAL
Neisseria Gonorrhea: NEGATIVE

## 2021-12-24 LAB — RPR: RPR Ser Ql: NONREACTIVE

## 2021-12-24 LAB — HIV-1 RNA QUANT-NO REFLEX-BLD
HIV 1 RNA Quant: 22 Copies/mL — ABNORMAL HIGH
HIV-1 RNA Quant, Log: 1.34 Log cps/mL — ABNORMAL HIGH

## 2021-12-24 NOTE — Progress Notes (Signed)
Left message for patient to call for instructions.

## 2021-12-25 ENCOUNTER — Other Ambulatory Visit: Payer: Self-pay

## 2021-12-25 ENCOUNTER — Encounter (HOSPITAL_BASED_OUTPATIENT_CLINIC_OR_DEPARTMENT_OTHER): Payer: Self-pay | Admitting: Urology

## 2021-12-25 NOTE — Progress Notes (Signed)
Pre procedure call complete.  Driver secured his wife Talbert Forest will be his caregiver and driver.  Instructions given and patient states understanding.

## 2021-12-26 ENCOUNTER — Other Ambulatory Visit (HOSPITAL_COMMUNITY): Payer: Self-pay

## 2021-12-26 NOTE — H&P (Signed)
CC: History of elevated PSA  HPI:  12/22/2017  Jay Hoffman is a 76 year-old male established patient who is here evaluation of his PSA.   His last PSA was performed 09/27/2016. The last PSA value was 6.42. The patient states he does take 5 alpha reductase inhibitor medication. He has undergone a prior prostate biopsy. Patient does not have a family history of prostate cancer. He has not recently had unwanted weight loss. He does not have a history of prostatitis.   Previous biopsies in 2001 and 2007 that were negative. PSA in 2007 was 10.9. After that time, he was started on finasteride. PSA in 1/14 was 6.5, and 5.1 in 8/14. PSA was 6.42 in October 2017. Repeat PSA in July 2018 was 3.4 (6.8 when corrected for finasteride). Prostate 3+ benign in January 2018. Repeat PSA in January 2019 was 3.96 or approximately 8 accounting for finasteride.,   01/01/2019  Previous patient of Dr. Ivory Broad. He continues to take Proscar. He has a history of 2 negative biopsies. The last one is in 2007 and his PSA was 10.9. He has since been on finasteride and his PSA has been stable since. Last PSA on 12/28/2018 was 3.37 which is 6.74 corrected for finasteride use. He has no voiding complaints today. Denies hematuria or dysuria.   02/27/2021  Most recent PSA was 6.56. He remains on finasteride so corrected for finasteride his PSA is about 13.12. His PSA tends to fluctuate. Prostate exam is benign. He has minimal voiding complaints.   08/28/2021  Most recent PSA stable and is 4.95. He does have microscopic hematuria today. Denies gross hematuria.   11/01/2021  In the interval, the patient underwent a CT IVP. There was poor opacification of the right renal collecting system and ureter. He did have a 12 mm nonobstructing left upper pole calculus. No other stones. He had mild circumferential bladder wall thickening with prostatic megaly. He was also noted to have borderline enlarged lymph nodes along the pelvic sidewalls for  which attention to follow-up was recommended. He also had a small pulmonary lung nodule. Attention to follow-up was also recommended for this.     ALLERGIES: No Allergies    MEDICATIONS: Cetirizine Hcl 10 mg tablet  [name] 50 mg-300 mg tablet  Eye Drops  Pravastatin Sodium 80 mg tablet  Proscar 5 mg tablet Oral     GU PSH: Locm 300-399Mg /Ml Iodine,1Ml - 09/04/2021       PSH Notes: Hemorrhoidectomy, Hemorrhoidectomy   NON-GU PSH: Cholecystectomy (open) - 2006 Hemorrhoidectomy (favorite) - 2008, 2008     GU PMH: Microscopic hematuria - 09/04/2021, - 08/28/2021 BPH w/o LUTS - 08/28/2021, - 02/27/2021, - 2018 Elevated PSA - 08/28/2021, - 02/27/2021, - 2019, - 2018, - 2018      PMH Notes:  1898-12-02 00:00:00 - Note: Normal Routine History And Physical Adult   NON-GU PMH: Personal history of other diseases of the digestive system, History of esophageal reflux - 2014 Personal history of other endocrine, nutritional and metabolic disease, History of hypercholesterolemia - 2014 GERD Hypercholesterolemia    FAMILY HISTORY: 12 daughters - Daughter Family Health Status Number - Runs In Family father deceased at age 46 - Father Heart Disease - Mother leukemia - Sister mother deceased in her 75's - Mother   SOCIAL HISTORY: Marital Status: Married Preferred Language: English; Ethnicity: Not Hispanic Or Latino; Race: Black or African American Current Smoking Status: Patient has never smoked.   Tobacco Use Assessment Completed: Used Tobacco in last 30 days?  Has never drank.  Drinks 2 caffeinated drinks per day. 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     Notes: Death In The Family Mother, Tobacco Use, Caffeine Use, Death In The Family Father, Occupation:, Alcohol Use, Marital History - Currently Married   REVIEW OF SYSTEMS:    GU Review Male:   Patient denies frequent urination, hard to postpone urination, burning/ pain with urination, get up at night to urinate, leakage of  urine, stream starts and stops, trouble starting your stream, have to strain to urinate , erection problems, and penile pain.  Gastrointestinal (Upper):   Patient denies nausea, vomiting, and indigestion/ heartburn.  Gastrointestinal (Lower):   Patient denies diarrhea and constipation.  Constitutional:   Patient denies fever, night sweats, weight loss, and fatigue.  Skin:   Patient denies skin rash/ lesion and itching.  Eyes:   Patient denies blurred vision and double vision.  Ears/ Nose/ Throat:   Patient denies sore throat and sinus problems.  Hematologic/Lymphatic:   Patient denies swollen glands and easy bruising.  Cardiovascular:   Patient denies leg swelling and chest pains.  Respiratory:   Patient denies cough and shortness of breath.  Endocrine:   Patient denies excessive thirst.  Musculoskeletal:   Patient denies back pain and joint pain.  Neurological:   Patient denies headaches and dizziness.  Psychologic:   Patient denies depression and anxiety.   VITAL SIGNS: None   GU PHYSICAL EXAMINATION:    Penis: Circumcised, no warts, no cracks. No dorsal Peyronie's plaques, no left corporal Peyronie's plaques, no right corporal Peyronie's plaques, no scarring, no warts. No balanitis, no meatal stenosis.   MULTI-SYSTEM PHYSICAL EXAMINATION:       Complexity of Data:  Source Of History:  Patient  Records Review:   Previous Doctor Records, Previous Patient Records  Urine Test Review:   Urinalysis  X-Ray Review: C.T. Abdomen/Pelvis: Reviewed Films. Reviewed Report. Discussed With Patient.     08/21/21 02/20/21 12/28/18 12/15/17 06/09/17 11/19/07 07/20/07 01/19/07  PSA  Total PSA 4.95 ng/mL 6.56 ng/mL 3.37 ng/mL 3.96 ng/mL 3.40 ng/mL 6.32  10.91  6.20   Free PSA        1.75   % Free PSA        28.2     PROCEDURES:         Flexible Cystoscopy - 52000  Risks, benefits, and some of the potential complications of the procedure were discussed at length with the patient including  infection, bleeding, voiding discomfort, urinary retention, fever, chills, sepsis, and others. All questions were answered. Informed consent was obtained. Antibiotic prophylaxis was given. Sterile technique and intraurethral analgesia were used.  Meatus:  Normal size. Normal location. Normal condition.  Urethra:  No strictures.  External Sphincter:  Normal.  Verumontanum:  Normal.  Prostate:  Obstructing. Enlarged median lobe. Severe hyperplasia.  Bladder Neck:  Non-obstructing.  Ureteral Orifices:  Normal location. Normal size. Normal shape.   Bladder:  No trabeculation. No tumors. Normal mucosa. No stones.      The lower urinary tract was carefully examined. The procedure was well-tolerated and without complications. Antibiotic instructions were given. Instructions were given to call the office immediately for bloody urine, difficulty urinating, urinary retention, painful or frequent urination, fever, chills, nausea, vomiting or other illness. The patient stated that he understood these instructions and would comply with them.         Urinalysis w/Scope Dipstick Dipstick Cont'd Micro  Color: Yellow Bilirubin: Neg mg/dL WBC/hpf: 0 - 5/hpf  Appearance:  Slightly Cloudy Ketones: Neg mg/dL RBC/hpf: 20 - 40/hpf  Specific Gravity: 1.025 Blood: 2+ ery/uL Bacteria: Few (10-25/hpf)  pH: 5.5 Protein: 1+ mg/dL Cystals: NS (Not Seen)  Glucose: Neg mg/dL Urobilinogen: 0.2 mg/dL Casts: NS (Not Seen)    Nitrites: Neg Trichomonas: Not Present    Leukocyte Esterase: Neg leu/uL Mucous: Present      Epithelial Cells: 0 - 5/hpf      Yeast: NS (Not Seen)      Sperm: Not Present    ASSESSMENT:      ICD-10 Details  1 GU:   Renal calculus - N20.0 Undiagnosed New Problem  2   Elevated PSA - R97.20 Chronic, Stable  3   Microscopic hematuria - R31.21 Chronic, Worsening  4   BPH w/o LUTS - N40.0    PLAN:           Orders Labs Urine Culture          Schedule X-Rays: 2 Months - MRI Prostate GSORAD With  and Without I.V. Contrast  Return Visit/Planned Activity: 6-8 Weeks - MRI Prostate GSORAD          Document Letter(s):  Created for Patient: Clinical Summary         Notes:   We discussed the management of urinary stones. These options include observation, ureteroscopy, and shockwave lithotripsy. We discussed which options are relevant to these particular stones. We discussed the natural history of stones as well as the complications of untreated stones and the impact on quality of life without treatment as well as with each of the above listed treatments. We also discussed the efficacy of each treatment in its ability to clear the stone burden. With any of these management options I discussed the signs and symptoms of infection and the need for emergent treatment should these be experienced. For each option we discussed the ability of each procedure to clear the patient of their stone burden.   For observation I described the risks which include but are not limited to silent renal damage, life-threatening infection, need for emergent surgery, failure to pass stone, and pain.   For ureteroscopy I described the risks which include heart attack, stroke, pulmonary embolus, death, bleeding, infection, damage to contiguous structures, positioning injury, ureteral stricture, ureteral avulsion, ureteral injury, need for ureteral stent, inability to perform ureteroscopy, need for an interval procedure, inability to clear stone burden, stent discomfort and pain.   For shockwave lithotripsy I described the risks which include arrhythmia, kidney contusion, kidney hemorrhage, need for transfusion, pain, inability to break up stone, inability to pass stone fragments, Steinstrasse, infection associated with obstructing stones, need for different surgical procedure, need for repeat shockwave lithotripsy.   Given the incomplete opacification of the right renal collecting system, I offered him cystoscopy with  bilateral retrograde pyelogram in the operating room. Also offered concurrent ureteroscopy with laser lithotripsy of the stone. He does not want to proceed with this at this time. He would rather proceed with left ESWL. If hematuria persist despite stone treatment, may want to reconsider bilateral retrograde pyelogram to completely evaluate that right ureter.   Given his borderline enlarged pelvic lymph nodes, I will obtain an MRI of the prostate to fully evaluate his elevated PSA and prostate for high-grade lesions. This will help rule out prostate cancer given his 2 negative biopsies in the past. MRI of the prostate will hopefully also reevaluate the pelvic sidewall lymph nodes in 6 to 8 weeks.

## 2021-12-27 ENCOUNTER — Encounter (HOSPITAL_BASED_OUTPATIENT_CLINIC_OR_DEPARTMENT_OTHER): Payer: Self-pay | Admitting: Urology

## 2021-12-27 ENCOUNTER — Ambulatory Visit (HOSPITAL_COMMUNITY): Payer: Medicare HMO

## 2021-12-27 ENCOUNTER — Other Ambulatory Visit: Payer: Self-pay

## 2021-12-27 ENCOUNTER — Ambulatory Visit (HOSPITAL_BASED_OUTPATIENT_CLINIC_OR_DEPARTMENT_OTHER)
Admission: RE | Admit: 2021-12-27 | Discharge: 2021-12-27 | Disposition: A | Discharge status: Home / Self Care | Payer: Medicare HMO | Attending: Urology | Admitting: Urology

## 2021-12-27 ENCOUNTER — Encounter (HOSPITAL_BASED_OUTPATIENT_CLINIC_OR_DEPARTMENT_OTHER)
Admission: RE | Disposition: A | Discharge status: Home / Self Care | Payer: Self-pay | Source: Home / Self Care | Attending: Urology

## 2021-12-27 DIAGNOSIS — N200 Calculus of kidney: Secondary | ICD-10-CM | POA: Diagnosis not present

## 2021-12-27 DIAGNOSIS — M47816 Spondylosis without myelopathy or radiculopathy, lumbar region: Secondary | ICD-10-CM | POA: Diagnosis not present

## 2021-12-27 DIAGNOSIS — N400 Enlarged prostate without lower urinary tract symptoms: Secondary | ICD-10-CM | POA: Diagnosis not present

## 2021-12-27 DIAGNOSIS — Z01818 Encounter for other preprocedural examination: Secondary | ICD-10-CM | POA: Diagnosis not present

## 2021-12-27 DIAGNOSIS — R3129 Other microscopic hematuria: Secondary | ICD-10-CM | POA: Insufficient documentation

## 2021-12-27 DIAGNOSIS — Z9049 Acquired absence of other specified parts of digestive tract: Secondary | ICD-10-CM | POA: Diagnosis not present

## 2021-12-27 DIAGNOSIS — R972 Elevated prostate specific antigen [PSA]: Secondary | ICD-10-CM | POA: Diagnosis not present

## 2021-12-27 HISTORY — PX: EXTRACORPOREAL SHOCK WAVE LITHOTRIPSY: SHX1557

## 2021-12-27 SURGERY — LITHOTRIPSY, ESWL
Anesthesia: LOCAL | Laterality: Left

## 2021-12-27 MED ORDER — SODIUM CHLORIDE 0.9 % IV SOLN: INTRAVENOUS | Status: DC

## 2021-12-27 MED ORDER — OXYCODONE-ACETAMINOPHEN 5-325 MG PO TABS: 1.0000 | ORAL_TABLET | ORAL | 0 refills | Status: AC | PRN

## 2021-12-27 MED ORDER — CIPROFLOXACIN HCL 500 MG PO TABS
ORAL_TABLET | ORAL | Status: AC
  Filled 2021-12-27: qty 1

## 2021-12-27 MED ORDER — CIPROFLOXACIN HCL 500 MG PO TABS
500.0000 mg | ORAL_TABLET | ORAL | Status: AC
  Administered 2021-12-27: 500 mg via ORAL

## 2021-12-27 MED ORDER — DIAZEPAM 5 MG PO TABS
10.0000 mg | ORAL_TABLET | ORAL | Status: AC
Start: 2021-12-27 — End: 2021-12-27
  Administered 2021-12-27: 10 mg via ORAL

## 2021-12-27 MED ORDER — DIPHENHYDRAMINE HCL 25 MG PO CAPS
25.0000 mg | ORAL_CAPSULE | ORAL | Status: AC
  Administered 2021-12-27: 25 mg via ORAL

## 2021-12-27 MED ORDER — DIAZEPAM 5 MG PO TABS
ORAL_TABLET | ORAL | Status: AC
  Filled 2021-12-27: qty 2

## 2021-12-27 MED ORDER — DIPHENHYDRAMINE HCL 25 MG PO CAPS
ORAL_CAPSULE | ORAL | Status: AC
  Filled 2021-12-27: qty 1

## 2021-12-27 NOTE — Brief Op Note (Signed)
12/27/2021  3:57 PM  PATIENT:  Jay Hoffman  76 y.o. male  PRE-OPERATIVE DIAGNOSIS:  LEFT RENAL STONE  POST-OPERATIVE DIAGNOSIS:  * No post-op diagnosis entered *  PROCEDURE:  Procedure(s): LEFT EXTRACORPOREAL SHOCK WAVE LITHOTRIPSY (ESWL) (Left)  SURGEON:  Surgeon(s) and Role:    * Belva Agee, MD - Primary  PHYSICIAN ASSISTANT:   ASSISTANTS: none   ANESTHESIA:   IV sedation  EBL:  Minimal   BLOOD ADMINISTERED:none  DRAINS: none   LOCAL MEDICATIONS USED:  NONE  SPECIMEN:  No Specimen  DISPOSITION OF SPECIMEN:  N/A  COUNTS:  YES  TOURNIQUET:  * No tourniquets in log *  DICTATION: .Note written in EPIC  PLAN OF CARE: Discharge to home after PACU  PATIENT DISPOSITION:  PACU - hemodynamically stable.   Delay start of Pharmacological VTE agent (>24hrs) due to surgical blood loss or risk of bleeding: not applicable

## 2021-12-27 NOTE — Discharge Instructions (Signed)

## 2021-12-27 NOTE — Op Note (Signed)
3:57 PM  PATIENT:  Jay Hoffman  76 y.o. male  PRE-OPERATIVE DIAGNOSIS:  LEFT RENAL STONE  POST-OPERATIVE DIAGNOSIS:  * No post-op diagnosis entered *  PROCEDURE:  Procedure(s): LEFT EXTRACORPOREAL SHOCK WAVE LITHOTRIPSY (ESWL) (Left)  SURGEON:  Surgeon(s) and Role:    * Belva Agee, MD - Primary  PHYSICIAN ASSISTANT:   ASSISTANTS: none   ANESTHESIA:   IV sedation  EBL:  Minimal   BLOOD ADMINISTERED:none  DRAINS: none   LOCAL MEDICATIONS USED:  NONE  SPECIMEN:  No Specimen  DISPOSITION OF SPECIMEN:  N/A  COUNTS:  YES  TOURNIQUET:  * No tourniquets in log *  DICTATION: .Note written in EPIC  PLAN OF CARE: Discharge to home after PACU  PATIENT DISPOSITION:  PACU - hemodynamically stable.   Delay start of Pharmacological VTE agent (>24hrs) due to surgical blood loss or risk of bleeding: not applicable

## 2021-12-27 NOTE — Interval H&P Note (Signed)
History and Physical Interval Note:  12/27/2021 1:05 PM  Jay Hoffman  has presented today for surgery, with the diagnosis of LEFT RENAL STONE.  The various methods of treatment have been discussed with the patient and family. After consideration of risks, benefits and other options for treatment, the patient has consented to  Procedure(s): LEFT EXTRACORPOREAL SHOCK WAVE LITHOTRIPSY (ESWL) (Left) as a surgical intervention.  The patient's history has been reviewed, patient examined, no change in status, stable for surgery.  I have reviewed the patient's chart and labs.  Questions were answered to the patient's satisfaction.     Belva Agee

## 2021-12-28 ENCOUNTER — Encounter (HOSPITAL_BASED_OUTPATIENT_CLINIC_OR_DEPARTMENT_OTHER): Payer: Self-pay | Admitting: Urology

## 2022-01-01 NOTE — Progress Notes (Signed)
Subjective:  Chief complaint  : followup for HIV disease on meds    Patient ID: Jay Hoffman, male    DOB: 20-Aug-1946, 76 y.o.   MRN: [id number]  HPI   Jay Hoffman is a 76 year old black man living with HIV that is been perfectly suppressed on [name].   He had lithotripsy with removal of kidney stones on 12/24/2021  He also has had an MRI of his pelvis with read still pending when I saw him.  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[place])    Problems related to high-risk sexual behavior    Unspecified personal history presenting hazards to health     Past Surgical History:  Procedure Laterality Date   CHOLECYSTECTOMY     ERCP     EXTRACORPOREAL SHOCK WAVE LITHOTRIPSY Left 12/27/2021   Procedure: LEFT EXTRACORPOREAL SHOCK WAVE LITHOTRIPSY (ESWL);  Surgeon: Remi Haggard, MD;  [facility];  Service: Urology;  Laterality: Left;    Family History  Problem Relation Age of Onset   Esophageal cancer Father    Kidney disease Sister    Diabetes Paternal Aunt    Cancer Other    Colon cancer Neg Hx    Rectal cancer Neg Hx    Stomach cancer Neg Hx       Social History   Socioeconomic History   Marital status: Married    Spouse name: Not on file   Number of children: 2   Years of education: Not on file   Highest education level: Not on file  Occupational History   Occupation: retired Airline pilot  Tobacco Use   Smoking status: Never   Smokeless tobacco: Never  Substance and Sexual Activity   Alcohol use: Yes    Comment: occasional   Beer   Drug use: No   Sexual activity: Yes    Partners: Female    Comment:  pt. given condoms  Other Topics Concern   Not on file  Social History Narrative   Daily caffeine   Social Determinants of Health   Financial Resource Strain: Not on file  Food Insecurity: Not on file  Transportation Needs: Not on file  Physical Activity: Not on file  Stress: Not on file  Social Connections: Not on file    No Known Allergies   Current Outpatient Medications:    aspirin 81 MG chewable tablet, Chew by mouth daily., Disp: , Rfl:    dolutegravir-lamiVUDine ([name]) 50-300 MG tablet, Take 1 tablet by mouth daily., Disp: 30 tablet, Rfl: 11   dorzolamide-timolol (COSOPT) 22.3-6.8 MG/ML ophthalmic solution, , Disp: , Rfl:    finasteride (PROSCAR) 5 MG tablet, Take 5 mg by mouth daily., Disp: , Rfl:    oxyCODONE-acetaminophen (PERCOCET) 5-325 MG tablet, Take 1 tablet by mouth every 4 (four) hours as needed for severe pain., Disp: 20 tablet, Rfl: 0   pravastatin (PRAVACHOL) 80 MG tablet, , Disp: , Rfl: 5   PRESCRIPTION MEDICATION, 3 prescription eye gtts for glaucoma, Disp: , Rfl:    pseudoephedrine-guaifenesin (MUCINEX D) 60-600 MG 12 hr tablet, Take 1 tablet by mouth every 12 (twelve)  hours., Disp: , Rfl:    sildenafil (VIAGRA) 50 MG tablet, Take one half to one whole one half hour before sexual activity, Disp: 20 tablet, Rfl: 4  Current Facility-Administered Medications:    0.9 %  sodium chloride infusion, 500 mL, Intravenous, Once, Irene Shipper, MD   Review of Systems  Constitutional:  Negative for activity change, appetite change, chills, diaphoresis, fatigue, fever and unexpected weight change.  HENT:  Negative for congestion, rhinorrhea, sinus pressure, sneezing, sore throat and trouble swallowing.   Eyes:  Negative for photophobia and visual disturbance.  Respiratory:  Negative for cough, chest tightness, shortness of breath, wheezing and stridor.   Cardiovascular:  Negative for chest pain, palpitations and leg swelling.  Gastrointestinal:  Negative for  abdominal distention, abdominal pain, anal bleeding, blood in stool, constipation, diarrhea, nausea and vomiting.  Genitourinary:  Negative for difficulty urinating, dysuria, flank pain and hematuria.  Musculoskeletal:  Negative for arthralgias, back pain, gait problem, joint swelling and myalgias.  Skin:  Negative for color change, pallor, rash and wound.  Neurological:  Negative for dizziness, tremors, weakness and light-headedness.  Hematological:  Negative for adenopathy. Does not bruise/bleed easily.  Psychiatric/Behavioral:  Negative for agitation, behavioral problems, confusion, decreased concentration, dysphoric mood and sleep disturbance. The patient is not hyperactive.       Objective:   Physical Exam Constitutional:      Appearance: He is well-developed.  HENT:     Head: Normocephalic and atraumatic.  Eyes:     Conjunctiva/sclera: Conjunctivae normal.  Cardiovascular:     Rate and Rhythm: Normal rate and regular rhythm.  Pulmonary:     Effort: Pulmonary effort is normal. No respiratory distress.     Breath sounds: No wheezing.  Abdominal:     General: There is no distension.     Palpations: Abdomen is soft.  Musculoskeletal:        General: No tenderness. Normal range of motion.     Cervical back: Normal range of motion and neck supple.  Skin:    General: Skin is warm and dry.     Coloration: Skin is not pale.     Findings: No erythema or rash.  Neurological:     General: No focal deficit present.     Mental Status: He is alert and oriented to person, place, and time.  Psychiatric:        Mood and Affect: Mood normal.        Behavior: Behavior normal.        Thought Content: Thought content normal.        Judgment: Judgment normal.          Assessment & Plan:  HIV disease:  I have reviewed his most recent VL which was 22 on 1/202023 and CD4 which was 424 on same date   Lab Results  Component Value Date   HIV1RNAQUANT 22 (H) 12/21/2021   Lab Results   Component Value Date   CD4TABS 398 (L) 02/05/2021   CD4TABS 569 01/19/2020   CD4TABS 380 (L) 01/05/2019    I am continuing his [name]   Hypertension:  Well controlled   Hyperlipidemia  LDL from January 20th reviewed at at goal at 15  We will continue his pravastatin   Lipid Panel     Component Value Date/Time   CHOL 143 12/21/2021 0956   TRIG 140 12/21/2021 0956   HDL 34 (L) 12/21/2021 0956   CHOLHDL 4.2 12/21/2021 0956   VLDL 28 01/16/2017 0947  [place] 85 12/21/2021 0956   Erectile dysfunction: will continue his viagra   Elevated PSA: being evaluated by Urology  CKD: creatinine relatively stable  BMP Latest Ref Rng & Units 12/21/2021 02/05/2021 01/19/2020  Glucose 65 - 99 mg/dL 111(H) 99 89  BUN 7 - 25 mg/dL 21 15 17   Creatinine 0.70 - 1.28 mg/dL 1.72(H) 1.65(H) 1.80(H)  BUN/Creat Ratio 6 - 22 (calc) 12 9 9   Sodium 135 - 146 mmol/L 139 137 139  Potassium 3.5 - 5.3 mmol/L 4.2 4.7 5.0  Chloride 98 - 110 mmol/L 108 105 107  CO2 20 - 32 mmol/L 24 26 26   Calcium 8.6 - 10.3 mg/dL 9.5 9.7 9.9     Kidney stones : sp lithotripsy

## 2022-01-02 ENCOUNTER — Other Ambulatory Visit: Payer: Self-pay

## 2022-01-02 ENCOUNTER — Ambulatory Visit (INDEPENDENT_AMBULATORY_CARE_PROVIDER_SITE_OTHER): Payer: Medicare HMO | Admitting: Infectious Disease

## 2022-01-02 ENCOUNTER — Encounter: Payer: Self-pay | Admitting: Infectious Disease

## 2022-01-02 ENCOUNTER — Ambulatory Visit
Admission: RE | Admit: 2022-01-02 | Discharge: 2022-01-02 | Disposition: A | Discharge status: Home / Self Care | Payer: Medicare HMO | Source: Ambulatory Visit | Attending: Urology | Admitting: Urology

## 2022-01-02 ENCOUNTER — Other Ambulatory Visit (HOSPITAL_COMMUNITY): Payer: Self-pay

## 2022-01-02 VITALS — BP 119/69 | HR 74 | Temp 98.3°F | Wt 209.0 lb

## 2022-01-02 DIAGNOSIS — N183 Chronic kidney disease, stage 3 unspecified: Secondary | ICD-10-CM

## 2022-01-02 DIAGNOSIS — E785 Hyperlipidemia, unspecified: Secondary | ICD-10-CM | POA: Diagnosis not present

## 2022-01-02 DIAGNOSIS — N200 Calculus of kidney: Secondary | ICD-10-CM | POA: Insufficient documentation

## 2022-01-02 DIAGNOSIS — R972 Elevated prostate specific antigen [PSA]: Secondary | ICD-10-CM | POA: Diagnosis not present

## 2022-01-02 DIAGNOSIS — R69 Illness, unspecified: Secondary | ICD-10-CM | POA: Diagnosis not present

## 2022-01-02 DIAGNOSIS — N401 Enlarged prostate with lower urinary tract symptoms: Secondary | ICD-10-CM

## 2022-01-02 DIAGNOSIS — I10 Essential (primary) hypertension: Secondary | ICD-10-CM

## 2022-01-02 DIAGNOSIS — B20 Human immunodeficiency virus [HIV] disease: Secondary | ICD-10-CM

## 2022-01-02 HISTORY — DX: Calculus of kidney: N20.0

## 2022-01-02 MED ORDER — DOLUTEGRAVIR-LAMIVUDINE 50-300 MG PO TABS
1.0000 | ORAL_TABLET | Freq: Every day | ORAL | 11 refills | Status: AC
  Filled 2022-01-02 – 2022-01-29 (×3): qty 30, 30d supply, fill #0
  Filled 2022-02-14: qty 30, 30d supply, fill #1
  Filled 2022-03-19: qty 30, 30d supply, fill #2
  Filled 2022-04-17: qty 30, 30d supply, fill #3
  Filled 2022-05-14: qty 30, 30d supply, fill #4
  Filled 2022-06-13: qty 30, 30d supply, fill #5
  Filled 2022-07-11: qty 30, 30d supply, fill #6
  Filled 2022-08-07: qty 30, 30d supply, fill #7
  Filled 2022-09-09: qty 30, 30d supply, fill #8
  Filled 2022-10-08: qty 30, 30d supply, fill #9
  Filled 2022-11-06: qty 30, 30d supply, fill #10
  Filled 2022-12-05: qty 30, 30d supply, fill #11

## 2022-01-02 MED ORDER — GADOBENATE DIMEGLUMINE 529 MG/ML IV SOLN
20.0000 mL | Freq: Once | INTRAVENOUS | Status: AC | PRN
  Administered 2022-01-02: 20 mL via INTRAVENOUS

## 2022-01-04 ENCOUNTER — Telehealth: Payer: Self-pay

## 2022-01-04 NOTE — Telephone Encounter (Signed)
-----   Message from Randall Hiss, MD sent at 01/04/2022 11:43 AM EST ----- Regarding: FW: Can we make sure that Ilic is seeing Urology? They ordered the MRI which looks to have found prostate cancer but result sent to me ----- Message ----- From: Interface, Rad Results In Sent: 01/03/2022   4:14 PM EST To: Randall Hiss, MD

## 2022-01-04 NOTE — Telephone Encounter (Signed)
Called patient to see if he is being followed by Urology. Per Patient he has a follow up appt on 2/9. Did not disclose results to patient.  Spoke with Arline Asp at [facility] to confirm results were received. Confirmed results are visible for provider. Requested someone from that office review results and contact patient.  Will fax copy for MRI results. Juanita Laster, RMA

## 2022-01-07 DIAGNOSIS — H401133 Primary open-angle glaucoma, bilateral, severe stage: Secondary | ICD-10-CM | POA: Diagnosis not present

## 2022-01-07 DIAGNOSIS — H2513 Age-related nuclear cataract, bilateral: Secondary | ICD-10-CM | POA: Diagnosis not present

## 2022-01-10 DIAGNOSIS — N200 Calculus of kidney: Secondary | ICD-10-CM | POA: Diagnosis not present

## 2022-01-17 ENCOUNTER — Other Ambulatory Visit (HOSPITAL_COMMUNITY): Payer: Self-pay

## 2022-01-23 ENCOUNTER — Other Ambulatory Visit (HOSPITAL_COMMUNITY): Payer: Self-pay

## 2022-01-28 DIAGNOSIS — R69 Illness, unspecified: Secondary | ICD-10-CM | POA: Diagnosis not present

## 2022-01-28 DIAGNOSIS — B20 Human immunodeficiency virus [HIV] disease: Secondary | ICD-10-CM | POA: Diagnosis not present

## 2022-01-28 DIAGNOSIS — E782 Mixed hyperlipidemia: Secondary | ICD-10-CM | POA: Diagnosis not present

## 2022-01-28 DIAGNOSIS — E1169 Type 2 diabetes mellitus with other specified complication: Secondary | ICD-10-CM | POA: Diagnosis not present

## 2022-01-28 DIAGNOSIS — N183 Chronic kidney disease, stage 3 unspecified: Secondary | ICD-10-CM | POA: Diagnosis not present

## 2022-01-28 DIAGNOSIS — N400 Enlarged prostate without lower urinary tract symptoms: Secondary | ICD-10-CM | POA: Diagnosis not present

## 2022-01-28 DIAGNOSIS — H401133 Primary open-angle glaucoma, bilateral, severe stage: Secondary | ICD-10-CM | POA: Diagnosis not present

## 2022-01-28 DIAGNOSIS — E669 Obesity, unspecified: Secondary | ICD-10-CM | POA: Diagnosis not present

## 2022-01-28 DIAGNOSIS — R945 Abnormal results of liver function studies: Secondary | ICD-10-CM | POA: Diagnosis not present

## 2022-01-28 DIAGNOSIS — Z0001 Encounter for general adult medical examination with abnormal findings: Secondary | ICD-10-CM | POA: Diagnosis not present

## 2022-01-29 ENCOUNTER — Other Ambulatory Visit (HOSPITAL_COMMUNITY): Payer: Self-pay

## 2022-01-29 ENCOUNTER — Telehealth: Payer: Self-pay

## 2022-01-29 DIAGNOSIS — N201 Calculus of ureter: Secondary | ICD-10-CM | POA: Diagnosis not present

## 2022-01-29 NOTE — Telephone Encounter (Signed)
[facility] Patient Advocate Encounter   I was successful in securing patient a $ 7500.00 grant from [employer] to provide copayment coverage for [name].  The patient's out of pocket cost will be $15.00 monthly.     I have spoken with the patient.    The billing information is as follows and has been shared with [facility].      Dates of Eligibility: 01/23/22 through 12/01/22  Patient knows to call the office with questions or concerns.  Clearance Coots, CPhT Specialty Pharmacy Patient [facility] Phone: [phone] Fax:  [phone]

## 2022-02-08 DIAGNOSIS — N201 Calculus of ureter: Secondary | ICD-10-CM | POA: Diagnosis not present

## 2022-02-08 DIAGNOSIS — R972 Elevated prostate specific antigen [PSA]: Secondary | ICD-10-CM | POA: Diagnosis not present

## 2022-02-14 ENCOUNTER — Other Ambulatory Visit (HOSPITAL_COMMUNITY): Payer: Self-pay

## 2022-02-19 DIAGNOSIS — N201 Calculus of ureter: Secondary | ICD-10-CM | POA: Diagnosis not present

## 2022-02-25 ENCOUNTER — Other Ambulatory Visit (HOSPITAL_COMMUNITY): Payer: Self-pay

## 2022-03-06 DIAGNOSIS — N201 Calculus of ureter: Secondary | ICD-10-CM | POA: Diagnosis not present

## 2022-03-06 DIAGNOSIS — N400 Enlarged prostate without lower urinary tract symptoms: Secondary | ICD-10-CM | POA: Diagnosis not present

## 2022-03-11 ENCOUNTER — Other Ambulatory Visit: Payer: Self-pay | Admitting: Urology

## 2022-03-14 DIAGNOSIS — N200 Calculus of kidney: Secondary | ICD-10-CM | POA: Diagnosis not present

## 2022-03-14 DIAGNOSIS — N202 Calculus of kidney with calculus of ureter: Secondary | ICD-10-CM | POA: Diagnosis not present

## 2022-03-19 ENCOUNTER — Other Ambulatory Visit (HOSPITAL_COMMUNITY): Payer: Self-pay

## 2022-03-21 ENCOUNTER — Other Ambulatory Visit (HOSPITAL_COMMUNITY): Payer: Self-pay

## 2022-04-02 ENCOUNTER — Other Ambulatory Visit (HOSPITAL_COMMUNITY): Payer: Self-pay

## 2022-04-08 DIAGNOSIS — N200 Calculus of kidney: Secondary | ICD-10-CM | POA: Diagnosis not present

## 2022-04-08 DIAGNOSIS — Z466 Encounter for fitting and adjustment of urinary device: Secondary | ICD-10-CM | POA: Diagnosis not present

## 2022-04-15 DIAGNOSIS — N200 Calculus of kidney: Secondary | ICD-10-CM | POA: Diagnosis not present

## 2022-04-17 ENCOUNTER — Other Ambulatory Visit (HOSPITAL_COMMUNITY): Payer: Self-pay

## 2022-04-19 ENCOUNTER — Other Ambulatory Visit (HOSPITAL_COMMUNITY): Payer: Self-pay

## 2022-05-14 ENCOUNTER — Other Ambulatory Visit (HOSPITAL_COMMUNITY): Payer: Self-pay

## 2022-05-22 ENCOUNTER — Other Ambulatory Visit (HOSPITAL_COMMUNITY): Payer: Self-pay

## 2022-05-30 ENCOUNTER — Telehealth: Payer: Self-pay

## 2022-05-30 NOTE — Telephone Encounter (Signed)
-----   Message from Antony Blackbird, RN sent at 06/13/2021  3:12 PM EDT ----- Needs yearly LFT = 06/2022

## 2022-05-30 NOTE — Telephone Encounter (Signed)
Pt notified that it is time to have labs drawn, Order in epic.

## 2022-06-13 ENCOUNTER — Other Ambulatory Visit (INDEPENDENT_AMBULATORY_CARE_PROVIDER_SITE_OTHER): Payer: Medicare HMO

## 2022-06-13 ENCOUNTER — Other Ambulatory Visit: Payer: Self-pay

## 2022-06-13 ENCOUNTER — Other Ambulatory Visit (HOSPITAL_COMMUNITY): Payer: Self-pay

## 2022-06-13 DIAGNOSIS — R7989 Other specified abnormal findings of blood chemistry: Secondary | ICD-10-CM

## 2022-06-13 LAB — HEPATIC FUNCTION PANEL
ALT: 11 U/L (ref 0–53)
AST: 15 U/L (ref 0–37)
Albumin: 4.1 g/dL (ref 3.5–5.2)
Alkaline Phosphatase: 75 U/L (ref 39–117)
Bilirubin, Direct: 0.1 mg/dL (ref 0.0–0.3)
Total Bilirubin: 0.4 mg/dL (ref 0.2–1.2)
Total Protein: 7.9 g/dL (ref 6.0–8.3)

## 2022-06-20 ENCOUNTER — Other Ambulatory Visit (HOSPITAL_COMMUNITY): Payer: Self-pay

## 2022-07-08 DIAGNOSIS — H2513 Age-related nuclear cataract, bilateral: Secondary | ICD-10-CM | POA: Diagnosis not present

## 2022-07-08 DIAGNOSIS — H401133 Primary open-angle glaucoma, bilateral, severe stage: Secondary | ICD-10-CM | POA: Diagnosis not present

## 2022-07-11 ENCOUNTER — Other Ambulatory Visit (HOSPITAL_COMMUNITY): Payer: Self-pay

## 2022-07-17 ENCOUNTER — Other Ambulatory Visit (HOSPITAL_COMMUNITY): Payer: Self-pay

## 2022-08-07 ENCOUNTER — Other Ambulatory Visit (HOSPITAL_COMMUNITY): Payer: Self-pay

## 2022-08-14 ENCOUNTER — Other Ambulatory Visit (HOSPITAL_COMMUNITY): Payer: Self-pay

## 2022-09-09 ENCOUNTER — Other Ambulatory Visit (HOSPITAL_COMMUNITY): Payer: Self-pay

## 2022-09-12 ENCOUNTER — Other Ambulatory Visit (HOSPITAL_COMMUNITY): Payer: Self-pay

## 2022-10-08 ENCOUNTER — Other Ambulatory Visit (HOSPITAL_COMMUNITY): Payer: Self-pay

## 2022-10-14 DIAGNOSIS — H401133 Primary open-angle glaucoma, bilateral, severe stage: Secondary | ICD-10-CM | POA: Diagnosis not present

## 2022-10-14 DIAGNOSIS — H2511 Age-related nuclear cataract, right eye: Secondary | ICD-10-CM | POA: Diagnosis not present

## 2022-10-16 ENCOUNTER — Other Ambulatory Visit (HOSPITAL_COMMUNITY): Payer: Self-pay

## 2022-11-06 ENCOUNTER — Other Ambulatory Visit (HOSPITAL_COMMUNITY): Payer: Self-pay

## 2022-11-12 ENCOUNTER — Other Ambulatory Visit: Payer: Self-pay

## 2022-12-05 ENCOUNTER — Other Ambulatory Visit (HOSPITAL_COMMUNITY): Payer: Self-pay

## 2022-12-05 ENCOUNTER — Other Ambulatory Visit: Payer: Self-pay

## 2022-12-11 ENCOUNTER — Other Ambulatory Visit: Payer: Self-pay

## 2022-12-11 ENCOUNTER — Other Ambulatory Visit (HOSPITAL_COMMUNITY): Payer: Self-pay

## 2022-12-11 ENCOUNTER — Telehealth: Payer: Self-pay

## 2022-12-11 NOTE — Telephone Encounter (Signed)
[facility] Patient Advocate Encounter   I was successful in securing patient a $ 10,000.00 grant from [employer] to provide copayment coverage for [name].  The patient's out of pocket cost will be $10.00 monthly.     I have spoken with the patient.    The billing information is as follows and has been shared with [facility].          Dates of Eligibility: 12/11/22 through 12/02/23  Patient knows to call the office with questions or concerns.  Ileene Patrick, [place] [facility] Patient [facility] Phone: [phone] Fax:  [phone]

## 2022-12-12 DIAGNOSIS — H2511 Age-related nuclear cataract, right eye: Secondary | ICD-10-CM | POA: Diagnosis not present

## 2022-12-12 DIAGNOSIS — H401113 Primary open-angle glaucoma, right eye, severe stage: Secondary | ICD-10-CM | POA: Diagnosis not present

## 2022-12-22 DIAGNOSIS — H2512 Age-related nuclear cataract, left eye: Secondary | ICD-10-CM | POA: Diagnosis not present

## 2022-12-26 DIAGNOSIS — H401123 Primary open-angle glaucoma, left eye, severe stage: Secondary | ICD-10-CM | POA: Diagnosis not present

## 2022-12-26 DIAGNOSIS — H2512 Age-related nuclear cataract, left eye: Secondary | ICD-10-CM | POA: Diagnosis not present

## 2023-01-02 ENCOUNTER — Other Ambulatory Visit: Payer: Self-pay | Admitting: Infectious Disease

## 2023-01-02 ENCOUNTER — Other Ambulatory Visit (HOSPITAL_COMMUNITY): Payer: Self-pay

## 2023-01-02 DIAGNOSIS — B20 Human immunodeficiency virus [HIV] disease: Secondary | ICD-10-CM

## 2023-01-08 ENCOUNTER — Other Ambulatory Visit (HOSPITAL_COMMUNITY): Payer: Self-pay

## 2023-01-30 ENCOUNTER — Other Ambulatory Visit (HOSPITAL_COMMUNITY): Payer: Self-pay

## 2023-02-03 ENCOUNTER — Other Ambulatory Visit (HOSPITAL_COMMUNITY): Payer: Self-pay

## 2023-02-03 DIAGNOSIS — E782 Mixed hyperlipidemia: Secondary | ICD-10-CM | POA: Diagnosis not present

## 2023-02-03 DIAGNOSIS — N400 Enlarged prostate without lower urinary tract symptoms: Secondary | ICD-10-CM | POA: Diagnosis not present

## 2023-02-03 DIAGNOSIS — E669 Obesity, unspecified: Secondary | ICD-10-CM | POA: Diagnosis not present

## 2023-02-03 DIAGNOSIS — R945 Abnormal results of liver function studies: Secondary | ICD-10-CM | POA: Diagnosis not present

## 2023-02-03 DIAGNOSIS — N183 Chronic kidney disease, stage 3 unspecified: Secondary | ICD-10-CM | POA: Diagnosis not present

## 2023-02-03 DIAGNOSIS — Z0001 Encounter for general adult medical examination with abnormal findings: Secondary | ICD-10-CM | POA: Diagnosis not present

## 2023-02-03 DIAGNOSIS — E1122 Type 2 diabetes mellitus with diabetic chronic kidney disease: Secondary | ICD-10-CM | POA: Diagnosis not present

## 2023-02-03 DIAGNOSIS — H401133 Primary open-angle glaucoma, bilateral, severe stage: Secondary | ICD-10-CM | POA: Diagnosis not present

## 2023-02-03 DIAGNOSIS — R69 Illness, unspecified: Secondary | ICD-10-CM | POA: Diagnosis not present

## 2023-02-05 ENCOUNTER — Encounter: Payer: Self-pay | Admitting: Family

## 2023-02-05 ENCOUNTER — Other Ambulatory Visit: Payer: Self-pay

## 2023-02-05 ENCOUNTER — Other Ambulatory Visit (HOSPITAL_COMMUNITY): Payer: Self-pay

## 2023-02-05 ENCOUNTER — Ambulatory Visit (INDEPENDENT_AMBULATORY_CARE_PROVIDER_SITE_OTHER): Payer: Medicare HMO | Admitting: Family

## 2023-02-05 VITALS — BP 131/81 | HR 76 | Ht 69.5 in | Wt 216.0 lb

## 2023-02-05 DIAGNOSIS — Z113 Encounter for screening for infections with a predominantly sexual mode of transmission: Secondary | ICD-10-CM

## 2023-02-05 DIAGNOSIS — R69 Illness, unspecified: Secondary | ICD-10-CM | POA: Diagnosis not present

## 2023-02-05 DIAGNOSIS — N183 Chronic kidney disease, stage 3 unspecified: Secondary | ICD-10-CM | POA: Diagnosis not present

## 2023-02-05 DIAGNOSIS — Z0000 Encounter for general adult medical examination without abnormal findings: Secondary | ICD-10-CM | POA: Diagnosis not present

## 2023-02-05 DIAGNOSIS — B20 Human immunodeficiency virus [HIV] disease: Secondary | ICD-10-CM | POA: Diagnosis not present

## 2023-02-05 MED ORDER — DOVATO 50-300 MG PO TABS
1.0000 | ORAL_TABLET | Freq: Every day | ORAL | 11 refills | Status: DC
  Filled 2023-02-05: qty 30, 30d supply, fill #0
  Filled 2023-02-20: qty 30, 30d supply, fill #1
  Filled 2023-03-27: qty 30, 30d supply, fill #2
  Filled 2023-04-22: qty 30, 30d supply, fill #3
  Filled 2023-05-14: qty 30, 30d supply, fill #4
  Filled 2023-06-18: qty 30, 30d supply, fill #5
  Filled 2023-07-17: qty 30, 30d supply, fill #6
  Filled 2023-08-14: qty 30, 30d supply, fill #7
  Filled 2023-09-11: qty 30, 30d supply, fill #8
  Filled 2023-10-09: qty 30, 30d supply, fill #9
  Filled 2023-11-03: qty 30, 30d supply, fill #10
  Filled 2023-12-05: qty 30, 30d supply, fill #11

## 2023-02-05 NOTE — Assessment & Plan Note (Signed)
Discussed importance of safe sexual practice and condom use. Condoms and STD testing offered.  Reviewed vaccines. Will be getting Shingrix vaccine and due for tetanus at the end of the year.  Encouraged to complete routine dental care and can refer to [facility] clinic if needed.

## 2023-02-05 NOTE — Patient Instructions (Addendum)
Nice to see you.  We will check your lab work today.  Continue to take your medication daily as prescribed.  Refills have been sent to the pharmacy.  Plan for follow up with Dr. Tommy Medal 1 year or sooner if needed with lab work on the same day.  Have a great day and stay safe!

## 2023-02-05 NOTE — Assessment & Plan Note (Signed)
Jay Hoffman continues to have well controlled virus with good adherence and tolerance to [name]. Reviewed previous lab work and discussed plan of care. Check lab work. Continue current dose of [name]. Plan for follow up in 1 year or sooner if needed with lab work on the same day.

## 2023-02-05 NOTE — Progress Notes (Signed)
Brief Narrative   Patient ID: Jay Hoffman, male    DOB: 26-Apr-1946, 77 y.o.   MRN: [id number]    Subjective:    Chief Complaint  Patient presents with   Follow-up    B20    HPI:  Jay Hoffman is a 77 y.o. male with HIV disease last seen on Jay Hoffman on 01/02/22 with well controlled virus and good adherence and tolerance to [name]. Viral load was undetectable and CD4 count 424.  STI testing negative. 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 Here today for follow up.   Jay Hoffman has been doing well since his last office visit and since that time has undergone kidney stone removal and had glaucoma surgery to bilateral eyes. Working on improving his overall health. Continues to take [name] with no adverse side effects and no problems obtaining medication from the pharmacy. Condoms and STD testing offered. Due for routine dental care. Recently received a Covid booster and RSV vaccine with plans to get Shingrix.   Denies fevers, chills, night sweats, headaches, changes in vision, neck pain/stiffness, nausea, diarrhea, vomiting, lesions or rashes.   No Known Allergies    Outpatient Medications Prior to Visit  Medication Sig Dispense Refill   aspirin 81 MG chewable tablet Chew by mouth daily.     dorzolamide-timolol (COSOPT) 22.3-6.8 MG/ML ophthalmic solution      finasteride (PROSCAR) 5 MG tablet Take 5 mg by mouth daily.     pravastatin (PRAVACHOL) 80 MG tablet   5   PRESCRIPTION MEDICATION 3 prescription eye gtts for glaucoma     pseudoephedrine-guaifenesin (MUCINEX D) 60-600 MG 12 hr tablet Take 1 tablet by mouth every 12 (twelve) hours.     sildenafil (VIAGRA) 50 MG tablet Take one half to one whole one half hour before sexual activity 20 tablet 4   dolutegravir-lamiVUDine ([name]) 50-300 MG tablet Take 1 tablet by mouth daily.     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  Eyes:  Negative for visual disturbance.  Respiratory:  Negative for cough, chest tightness, shortness of breath and wheezing.   Cardiovascular:  Negative for chest pain and leg swelling.  Gastrointestinal:  Negative for abdominal pain, constipation, diarrhea, nausea and vomiting.  Genitourinary:  Negative for  dysuria, flank pain, frequency, genital sores, hematuria and urgency.  Skin:  Negative for rash.  Allergic/Immunologic: Negative for immunocompromised state.  Neurological:  Negative for dizziness and headaches.      Objective:    BP 131/81   Pulse 76   Ht 5' 9.5" (1.765 m)   Wt 216 lb (98 kg)   BMI 31.44 kg/m  Nursing note and vital signs reviewed.  Physical Exam Constitutional:      General: He is not in acute distress.    Appearance: He is well-developed.  Eyes:     Conjunctiva/sclera: Conjunctivae normal.  Cardiovascular:     Rate and Rhythm:  Normal rate and regular rhythm.     Heart sounds: Normal heart sounds. No murmur heard.    No friction rub. No gallop.  Pulmonary:     Effort: Pulmonary effort is normal. No respiratory distress.     Breath sounds: Normal breath sounds. No wheezing or rales.  Chest:     Chest wall: No tenderness.  Abdominal:     General: Bowel sounds are normal.     Palpations: Abdomen is soft.     Tenderness: There is no abdominal tenderness.  Musculoskeletal:     Cervical back: Neck supple.  Lymphadenopathy:     Cervical: No cervical adenopathy.  Skin:    General: Skin is warm and dry.     Findings: No rash.  Neurological:     Mental Status: He is alert and oriented to person, place, and time.  Psychiatric:        Behavior: Behavior normal.        Thought Content: Thought content normal.        Judgment: Judgment normal.         02/05/2023    2:19 PM 01/02/2022    2:10 PM 06/06/2020    8:10 AM 01/19/2019    8:56 AM 01/19/2018    9:37 AM  Depression screen PHQ 2/9  Decreased Interest 0 0 0 0 0  Down, Depressed, Hopeless 0 0 0 0 0  PHQ - 2 Score 0 0 0 0 0       Assessment & Plan:    Patient Active Problem List   Diagnosis Date Noted   Healthcare maintenance 02/05/2023   Kidney stones 01/02/2022   Chronic kidney disease (CKD) 01/19/2019   AIDS ([place]) 12/26/2014   PSA elevation 12/27/2013   ED (erectile dysfunction) of organic origin 05/13/2011   FATIGUE 11/19/2010   BILE DUCT STRICTURE 09/06/2010   Essential hypertension 08/08/2010   PERFORATION OF BILE DUCT 07/30/2010   Hyperlipidemia 11/09/2007   Enlarged prostate with lower urinary tract symptoms (LUTS) 09/21/2007   Human immunodeficiency virus (HIV) disease ([place]) 12/10/2006   CRYPTOCOCCOSIS 12/10/2006   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  Encouraged to continue to work on improving overall health and ensuring blood pressure remains well controlled given underlying HIV disease which increases risk. Check renal function. Continue hypertension management per Internal Medicine.         Other   Human immunodeficiency virus (HIV) disease ([place]) - Primary    Mr. Saah continues to have well controlled virus with good adherence and tolerance to [name]. Reviewed previous lab work and discussed plan of care. Check lab work. Continue current dose of  [name]. Plan for follow up in 1 year or sooner if needed with lab work on the same day.       Relevant Medications   dolutegravir-lamiVUDine ([name]) 50-300 MG tablet   Other Relevant Orders   COMPLETE METABOLIC PANEL WITH GFR   HIV-1 RNA quant-no reflex-bld   T-helper cell (CD4)- ([facility] clinic only)   Healthcare maintenance    Discussed importance of safe sexual practice and condom use. Condoms and STD testing offered.  Reviewed vaccines. Will be getting Shingrix vaccine and due for tetanus at the end of the year.  Encouraged to complete routine dental care and can refer to [facility] clinic if needed.       Other Visit Diagnoses     Screening for STDs (sexually transmitted diseases)       Relevant Orders   RPR        I am having [place] maintain his finasteride, sildenafil, pravastatin, pseudoephedrine-guaifenesin, PRESCRIPTION MEDICATION, dorzolamide-timolol, aspirin, and [name]. We will continue to administer sodium chloride.   Meds ordered this encounter  Medications   dolutegravir-lamiVUDine ([name]) 50-300 MG tablet    Sig: Take 1 tablet by mouth daily.    Dispense:  30 tablet    Refill:  11    Order Specific Question:   Supervising Provider    Answer:   Carlyle Basques [[id number]]     Follow-up: Return in about 1 year (around 02/05/2024).   Terri Piedra, MSN, FNP-C Nurse Practitioner [facility] [place]  number: [phone]

## 2023-02-05 NOTE — Assessment & Plan Note (Signed)
Mr. Milkey renal function remains in Stage 3 fluctuating between 3a and 3b. Encouraged to continue to work on improving overall health and ensuring blood pressure remains well controlled given underlying HIV disease which increases risk. Check renal function. Continue hypertension management per Internal Medicine.

## 2023-02-06 LAB — T-HELPER CELL (CD4) - (RCID CLINIC ONLY)
CD4 % Helper T Cell: 23 % — ABNORMAL LOW (ref 33–65)
CD4 T Cell Abs: 475 /uL (ref 400–1790)

## 2023-02-07 LAB — COMPLETE METABOLIC PANEL WITH GFR
AG Ratio: 1 (calc) (ref 1.0–2.5)
ALT: 12 U/L (ref 9–46)
AST: 18 U/L (ref 10–35)
Albumin: 4.3 g/dL (ref 3.6–5.1)
Alkaline phosphatase (APISO): 85 U/L (ref 35–144)
BUN/Creatinine Ratio: 12 (calc) (ref 6–22)
BUN: 21 mg/dL (ref 7–25)
CO2: 23 mmol/L (ref 20–32)
Calcium: 9.8 mg/dL (ref 8.6–10.3)
Chloride: 108 mmol/L (ref 98–110)
Creat: 1.71 mg/dL — ABNORMAL HIGH (ref 0.70–1.28)
Globulin: 4.3 g/dL (calc) — ABNORMAL HIGH (ref 1.9–3.7)
Glucose, Bld: 101 mg/dL — ABNORMAL HIGH (ref 65–99)
Potassium: 4 mmol/L (ref 3.5–5.3)
Sodium: 139 mmol/L (ref 135–146)
Total Bilirubin: 0.5 mg/dL (ref 0.2–1.2)
Total Protein: 8.6 g/dL — ABNORMAL HIGH (ref 6.1–8.1)
eGFR: 41 mL/min/{1.73_m2} — ABNORMAL LOW (ref >=60)

## 2023-02-07 LAB — RPR: RPR Ser Ql: NONREACTIVE

## 2023-02-07 LAB — HIV-1 RNA QUANT-NO REFLEX-BLD
HIV 1 RNA Quant: NOT DETECTED Copies/mL
HIV-1 RNA Quant, Log: NOT DETECTED Log cps/mL

## 2023-02-20 ENCOUNTER — Other Ambulatory Visit (HOSPITAL_COMMUNITY): Payer: Self-pay

## 2023-03-04 ENCOUNTER — Other Ambulatory Visit: Payer: Self-pay

## 2023-03-27 ENCOUNTER — Other Ambulatory Visit (HOSPITAL_COMMUNITY): Payer: Self-pay

## 2023-03-31 ENCOUNTER — Other Ambulatory Visit: Payer: Self-pay

## 2023-04-22 ENCOUNTER — Other Ambulatory Visit (HOSPITAL_COMMUNITY): Payer: Self-pay

## 2023-04-25 ENCOUNTER — Other Ambulatory Visit (HOSPITAL_COMMUNITY): Payer: Self-pay

## 2023-04-30 ENCOUNTER — Other Ambulatory Visit (HOSPITAL_COMMUNITY): Payer: Self-pay

## 2023-05-06 DIAGNOSIS — H401133 Primary open-angle glaucoma, bilateral, severe stage: Secondary | ICD-10-CM | POA: Diagnosis not present

## 2023-05-06 DIAGNOSIS — Z961 Presence of intraocular lens: Secondary | ICD-10-CM | POA: Diagnosis not present

## 2023-05-14 ENCOUNTER — Other Ambulatory Visit: Payer: Self-pay

## 2023-05-20 ENCOUNTER — Other Ambulatory Visit (HOSPITAL_COMMUNITY): Payer: Self-pay

## 2023-06-18 ENCOUNTER — Other Ambulatory Visit (HOSPITAL_COMMUNITY): Payer: Self-pay

## 2023-06-23 ENCOUNTER — Other Ambulatory Visit (HOSPITAL_COMMUNITY): Payer: Self-pay

## 2023-07-17 ENCOUNTER — Other Ambulatory Visit (HOSPITAL_COMMUNITY): Payer: Self-pay

## 2023-07-23 ENCOUNTER — Other Ambulatory Visit (HOSPITAL_COMMUNITY): Payer: Self-pay

## 2023-08-14 ENCOUNTER — Other Ambulatory Visit (HOSPITAL_COMMUNITY): Payer: Self-pay

## 2023-09-11 ENCOUNTER — Other Ambulatory Visit (HOSPITAL_COMMUNITY): Payer: Self-pay | Admitting: Pharmacy Technician

## 2023-09-11 ENCOUNTER — Other Ambulatory Visit (HOSPITAL_COMMUNITY): Payer: Self-pay

## 2023-09-11 NOTE — Progress Notes (Signed)
Specialty Pharmacy Refill Coordination Note  Jay Hoffman is a 77 y.o. male contacted today regarding refills of specialty medication(s) Dolutegravir-Lamivudine   Patient requested Delivery   Delivery date: 09/19/23   Verified address: [address] [name]   Medication will be filled on 09/18/23.

## 2023-09-12 DIAGNOSIS — H401133 Primary open-angle glaucoma, bilateral, severe stage: Secondary | ICD-10-CM | POA: Diagnosis not present

## 2023-09-12 DIAGNOSIS — Z961 Presence of intraocular lens: Secondary | ICD-10-CM | POA: Diagnosis not present

## 2023-09-18 ENCOUNTER — Other Ambulatory Visit: Payer: Self-pay

## 2023-10-09 ENCOUNTER — Other Ambulatory Visit: Payer: Self-pay

## 2023-10-09 NOTE — Progress Notes (Signed)
Specialty Pharmacy Refill Coordination Note  Jay Hoffman is a 77 y.o. male contacted today regarding refills of specialty medication(s) Dolutegravir-Lamivudine   Patient requested Delivery   Delivery date: 10/16/23   Verified address: [address]   Medication will be filled on 10/15/23.

## 2023-10-09 NOTE — Progress Notes (Signed)
Specialty Pharmacy Ongoing Clinical Assessment Note  Jay Hoffman is a 77 y.o. male who is being followed by the specialty pharmacy service for RxSp HIV   Patient's specialty medication(s) reviewed today: Dolutegravir-Lamivudine   Missed doses in the last 4 weeks: 0   Patient/Caregiver did not have any additional questions or concerns.   Therapeutic benefit summary: Patient is achieving benefit   Adverse events/side effects summary: No adverse events/side effects   Patient's therapy is appropriate to: Continue    Goals Addressed             This Visit's Progress    Achieve Undetectable HIV Viral Load < 20       Patient is on track. Patient will maintain adherence         Follow up:  6 months  Bobette Mo Specialty Pharmacist

## 2023-10-15 ENCOUNTER — Other Ambulatory Visit: Payer: Self-pay

## 2023-10-22 ENCOUNTER — Other Ambulatory Visit (HOSPITAL_COMMUNITY): Payer: Self-pay

## 2023-11-03 ENCOUNTER — Other Ambulatory Visit (HOSPITAL_COMMUNITY): Payer: Self-pay

## 2023-11-03 ENCOUNTER — Other Ambulatory Visit (HOSPITAL_COMMUNITY): Payer: Self-pay | Admitting: Pharmacy Technician

## 2023-11-03 NOTE — Progress Notes (Signed)
Specialty Pharmacy Refill Coordination Note  Jay Hoffman is a 77 y.o. male contacted today regarding refills of specialty medication(s) Dolutegravir-Lamivudine   Patient requested Delivery   Delivery date: 11/14/23   Verified address: [address]  [place]   Medication will be filled on 11/13/23.

## 2023-11-14 IMAGING — MR MR PROSTATE WO/W CM
11 series · 48 of 48 positions shown · IV contrast (multihance)
Comparison: None.

CLINICAL DATA: Elevated PSA level of 4.95 on 08/21/2021. Reportedly
negative biopsy in 7449.

EXAM:
MR PROSTATE WITHOUT AND WITH CONTRAST
TECHNIQUE: Multiplanar multisequence MRI images were obtained of the pelvis
centered about the prostate. Pre and post contrast images were
obtained.
CONTRAST:  20mL MULTIHANCE GADOBENATE DIMEGLUMINE 529 MG/ML IV SOLN

[Series 3: T2 · coronal · 3.0mm · 0.56mm/px · 1 of 31 slices shown (1 of 2)]
[im 1/31]
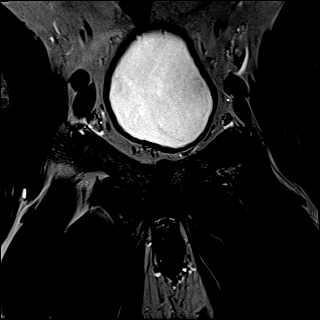

[Series 4: T1 · axial · 5.0mm · 1.25mm/px · 1 of 80 slices shown]
[im 1/80]
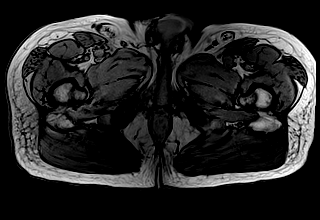

[Series 5: DWI · axial · 3.5mm · 1.75mm/px · 1 of 96 slices shown (1 of 3)]
[im 1/96]
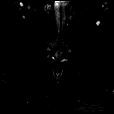

[Series 6: DWI · axial · 3.5mm · 1.75mm/px · 1 of 32 slices shown (2 of 3)]
[im 1/32]
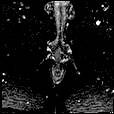

[Series 7: DWI · axial · 3.5mm · 1.75mm/px · 1 of 32 slices shown (3 of 3)]
[im 1/32]
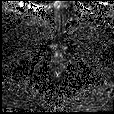

[Series 9: T2 · axial · 1.0mm · 1.04mm/px · 1 of 104 slices shown (2 of 2)]
[im 1/104]
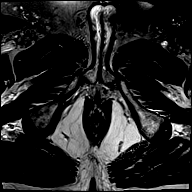

[Series 10: pre t1_twist_tra_dyn · axial · non-contrast · 3.5mm · 0.94mm/px · 1 of 30 slices shown]
[im 1/30]
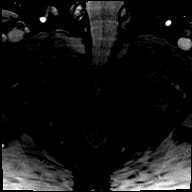

[Series 11: post t1_twist_tra_dyn-copy center · axial · non-contrast · 3.5mm · 0.94mm/px · z∈[-3,+98]mm · 19 of 900 slices shown]
[im 1/900]
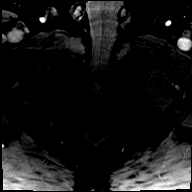
[im 50/900]
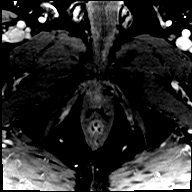
[im 100/900]
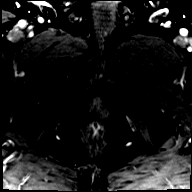
[im 150/900]
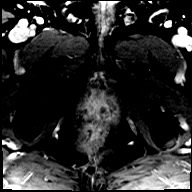
[im 200/900]
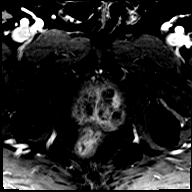
[im 250/900]
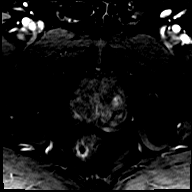
[im 300/900]
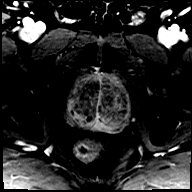
[im 350/900]
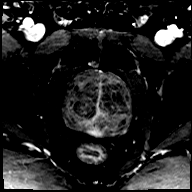
[im 400/900]
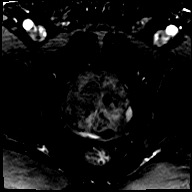
[im 450/900]
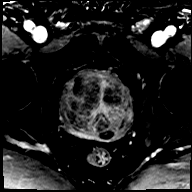
[im 500/900]
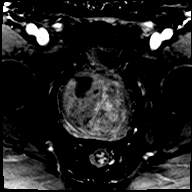
[im 550/900]
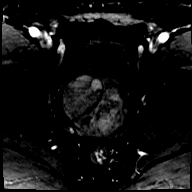
[im 600/900]
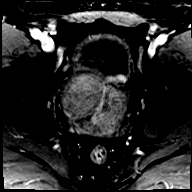
[im 650/900]
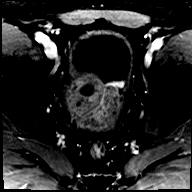
[im 700/900]
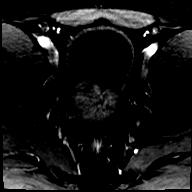
[im 750/900]
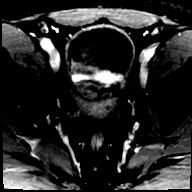
[im 800/900]
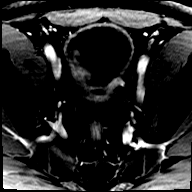
[im 850/900]
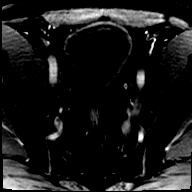
[im 900/900]
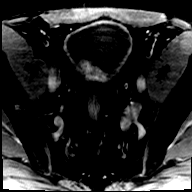

[Series 12: post t1_twist_tra_dyn-copy cent_sub · axial · 3.5mm · 0.94mm/px · z∈[-3,+98]mm · 18 of 866 slices shown]
[im 1/866]
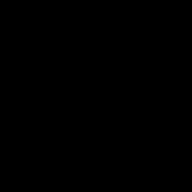
[im 51/866]
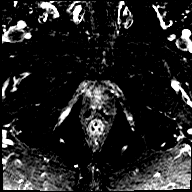
[im 102/866]
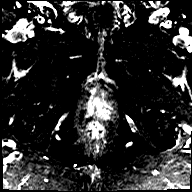
[im 153/866]
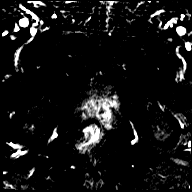
[im 204/866]
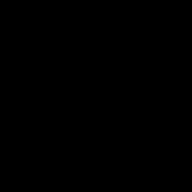
[im 255/866]
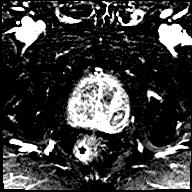
[im 306/866]
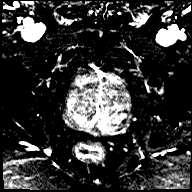
[im 357/866]
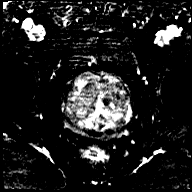
[im 408/866]
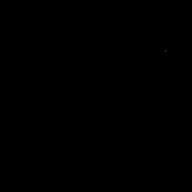
[im 458/866]
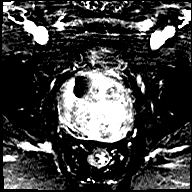
[im 509/866]
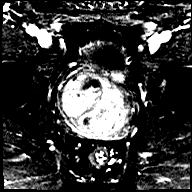
[im 560/866]
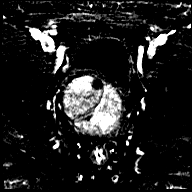
[im 611/866]
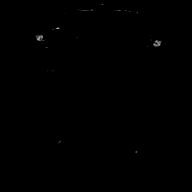
[im 662/866]
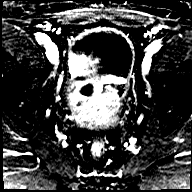
[im 713/866]
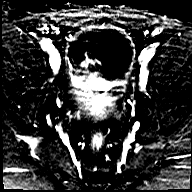
[im 764/866]
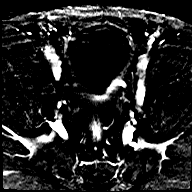
[im 815/866]
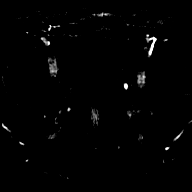
[im 866/866]
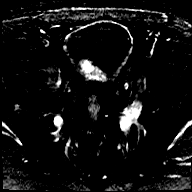

[Series 13: t1_vibe_dixon_tra_f · axial · 2.5mm · 0.91mm/px · z∈[-23,+174]mm · 2 of 80 slices shown]
[im 1/80]
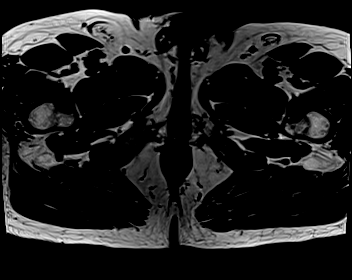
[im 80/80]
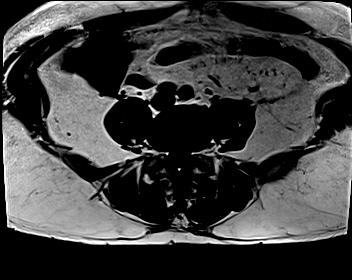

[Series 14: t1_vibe_dixon_tra_w · axial · 2.5mm · 0.91mm/px · z∈[-23,+174]mm · 2 of 80 slices shown]
[im 1/80]
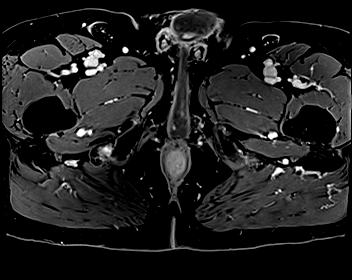
[im 80/80]
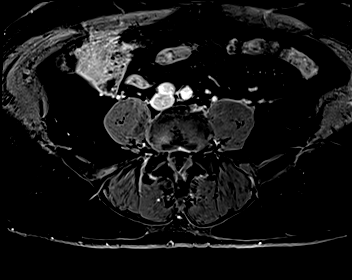

[48 of 48 positions shown; findings below may reference images not displayed]

FINDINGS: Prostate:

Encapsulated nodularity in the transition zone compatible with
benign prostatic hypertrophy.

Region of interest # 1: PI-RADS category 5 lesion of the left
posterolateral and posteromedial peripheral zone at the apex, with
focally reduced T2 signal (image 81, series 9), focal low ADC map
activity and restriction of diffusion (image 25 of series 6 and
series 7), and focal early enhancement (image 203, series 12). This
lesion measures 0.56 cc (1.5 by 0.5 by 1.3 cm).

Volume: 3D volumetric analysis: Prostate volume 177.94 cc (6.9 by
6.6 by 7.9 cm).

Transcapsular spread: Possible associated with region of interest #
1 given the slight bulging in the capsular contour for example on
image 81 series 9.

Seminal vesicle involvement: Absent

Neurovascular bundle involvement: Absent

Pelvic adenopathy: Absent

Bone metastasis: Absent

Other findings: Mild accentuated T1 signal in part of the right
seminal vesicle probably from proteinaceous contents.
IMPRESSION: 1. PI-RADS category 5 lesion of the left posterolateral
posteromedial peripheral zone at the apex. This has restriction of
diffusion, focal early enhancement, and qualifies (barely) as
PI-RADS category 5 given the long axis measurement of 1.5 cm. There
is some mild bulging of the capsule in this vicinity which could
indicate early transcapsular spread. Targeting data sent to [facility].
2. Substantial benign prostatic hypertrophy and prostatomegaly.

## 2023-11-21 ENCOUNTER — Other Ambulatory Visit (HOSPITAL_COMMUNITY): Payer: Self-pay

## 2023-12-02 ENCOUNTER — Other Ambulatory Visit: Payer: Self-pay

## 2023-12-05 ENCOUNTER — Other Ambulatory Visit (HOSPITAL_COMMUNITY): Payer: Self-pay | Admitting: Pharmacy Technician

## 2023-12-05 ENCOUNTER — Other Ambulatory Visit: Payer: Self-pay

## 2023-12-05 ENCOUNTER — Other Ambulatory Visit (HOSPITAL_COMMUNITY): Payer: Self-pay

## 2023-12-05 NOTE — Progress Notes (Signed)
 Specialty Pharmacy Refill Coordination Note  Jay Hoffman is a 78 y.o. male contacted today regarding refills of specialty medication(s) Dolutegravir -Lamivudine    Patient requested (Patient-Rptd) Delivery   Delivery date: (Patient-Rptd) 12/16/23   Verified address: (Patient-Rptd) [address] [place], [place] [address]   Medication will be filled on 12/15/23.

## 2023-12-15 ENCOUNTER — Other Ambulatory Visit (HOSPITAL_COMMUNITY): Payer: Self-pay

## 2023-12-15 ENCOUNTER — Telehealth: Payer: Self-pay

## 2023-12-15 NOTE — Telephone Encounter (Signed)
 [facility] Patient Advocate Encounter   I was successful in securing patient a $5,000.00 grant from [employer] ([employer]) to provide copayment coverage for [name] .  This will make the out of pocket cost $0.00.     I have spoken with the patient.    The billing information is as follows and has been shared with [facility].   Patient: Jay Hoffman CPR_logo Fund: HIV, AIDS and Prevention P5000 2025 Award Period: 06/18/2023 - 12/14/2024 Cardholder: [id number] BIN: [id number] PCN: [id number] Group: [id number] For pharmacy inquiries, contact PDMI at [phone]. For patient inquiries, contact PAF at [phone].         Patient knows to call the office with questions or concerns.  Arland Hutchinson, CPhT Specialty Pharmacy Patient [facility] Phone: [phone] Fax:  [phone]

## 2023-12-31 ENCOUNTER — Other Ambulatory Visit: Payer: Self-pay

## 2023-12-31 ENCOUNTER — Other Ambulatory Visit: Payer: Self-pay | Admitting: Family

## 2023-12-31 ENCOUNTER — Other Ambulatory Visit (HOSPITAL_COMMUNITY): Payer: Self-pay

## 2023-12-31 ENCOUNTER — Other Ambulatory Visit (HOSPITAL_COMMUNITY): Payer: Self-pay | Admitting: Pharmacy Technician

## 2023-12-31 MED ORDER — DOVATO 50-300 MG PO TABS
1.0000 | ORAL_TABLET | Freq: Every day | ORAL | 1 refills | Status: DC
  Filled 2023-12-31: qty 30, 30d supply, fill #0

## 2023-12-31 NOTE — Progress Notes (Signed)
Specialty Pharmacy Refill Coordination Note  Jay Hoffman is a 78 y.o. male contacted today regarding refills of specialty medication(s) Dolutegravir-lamiVUDine ([name])   Patient requested Delivery   Delivery date: 01/12/24   Verified address: Patient address [address]  [place]   Medication will be filled on 01/09/24.  Refill Request sent to MD

## 2024-01-09 ENCOUNTER — Other Ambulatory Visit: Payer: Self-pay

## 2024-01-12 DIAGNOSIS — H401133 Primary open-angle glaucoma, bilateral, severe stage: Secondary | ICD-10-CM | POA: Diagnosis not present

## 2024-01-12 DIAGNOSIS — Z961 Presence of intraocular lens: Secondary | ICD-10-CM | POA: Diagnosis not present

## 2024-01-16 ENCOUNTER — Other Ambulatory Visit: Payer: Self-pay

## 2024-01-16 DIAGNOSIS — B20 Human immunodeficiency virus [HIV] disease: Secondary | ICD-10-CM

## 2024-01-16 DIAGNOSIS — Z79899 Other long term (current) drug therapy: Secondary | ICD-10-CM

## 2024-01-16 DIAGNOSIS — Z113 Encounter for screening for infections with a predominantly sexual mode of transmission: Secondary | ICD-10-CM

## 2024-01-19 NOTE — Addendum Note (Signed)
 Addended by: Harley Alto on: 01/19/2024 02:44 PM   Modules accepted: Orders

## 2024-01-19 NOTE — Addendum Note (Signed)
 Addended by: Harley Alto on: 01/19/2024 02:43 PM   Modules accepted: Orders

## 2024-01-20 ENCOUNTER — Other Ambulatory Visit: Payer: Medicare HMO

## 2024-01-20 ENCOUNTER — Other Ambulatory Visit: Payer: Self-pay

## 2024-01-20 DIAGNOSIS — Z113 Encounter for screening for infections with a predominantly sexual mode of transmission: Secondary | ICD-10-CM

## 2024-01-20 DIAGNOSIS — B20 Human immunodeficiency virus [HIV] disease: Secondary | ICD-10-CM

## 2024-01-20 DIAGNOSIS — Z79899 Other long term (current) drug therapy: Secondary | ICD-10-CM | POA: Diagnosis not present

## 2024-01-22 LAB — COMPLETE METABOLIC PANEL WITH GFR
AG Ratio: 1.2 (calc) (ref 1.0–2.5)
ALT: 17 U/L (ref 9–46)
AST: 18 U/L (ref 10–35)
Albumin: 4.3 g/dL (ref 3.6–5.1)
Alkaline phosphatase (APISO): 80 U/L (ref 35–144)
BUN/Creatinine Ratio: 10 (calc) (ref 6–22)
BUN: 17 mg/dL (ref 7–25)
CO2: 25 mmol/L (ref 20–32)
Calcium: 9.6 mg/dL (ref 8.6–10.3)
Chloride: 108 mmol/L (ref 98–110)
Creat: 1.66 mg/dL — ABNORMAL HIGH (ref 0.70–1.28)
Globulin: 3.6 g/dL (ref 1.9–3.7)
Glucose, Bld: 106 mg/dL — ABNORMAL HIGH (ref 65–99)
Potassium: 4.9 mmol/L (ref 3.5–5.3)
Sodium: 140 mmol/L (ref 135–146)
Total Bilirubin: 0.4 mg/dL (ref 0.2–1.2)
Total Protein: 7.9 g/dL (ref 6.1–8.1)
eGFR: 42 mL/min/{1.73_m2} — ABNORMAL LOW (ref >=60)

## 2024-01-22 LAB — CBC WITH DIFFERENTIAL/PLATELET
Absolute Lymphocytes: 1712 {cells}/uL (ref 850–3900)
Absolute Monocytes: 466 {cells}/uL (ref 200–950)
Basophils Absolute: 48 {cells}/uL (ref 0–200)
Basophils Relative: 0.9 %
Eosinophils Absolute: 58 {cells}/uL (ref 15–500)
Eosinophils Relative: 1.1 %
HCT: 45.2 % (ref 38.5–50.0)
Hemoglobin: 15.2 g/dL (ref 13.2–17.1)
MCH: 30.3 pg (ref 27.0–33.0)
MCHC: 33.6 g/dL (ref 32.0–36.0)
MCV: 90 fL (ref 80.0–100.0)
MPV: 9.7 fL (ref 7.5–12.5)
Monocytes Relative: 8.8 %
Neutro Abs: 3016 {cells}/uL (ref 1500–7800)
Neutrophils Relative %: 56.9 %
Platelets: 236 10*3/uL (ref 140–400)
RBC: 5.02 10*6/uL (ref 4.20–5.80)
RDW: 13.4 % (ref 11.0–15.0)
Total Lymphocyte: 32.3 %
WBC: 5.3 10*3/uL (ref 3.8–10.8)

## 2024-01-22 LAB — HIV-1 RNA QUANT-NO REFLEX-BLD
HIV 1 RNA Quant: <20 {copies}/mL — ABNORMAL HIGH
HIV-1 RNA Quant, Log: <1.3 {Log} — ABNORMAL HIGH

## 2024-01-22 LAB — LIPID PANEL
Cholesterol: 135 mg/dL (ref <200)
HDL: 37 mg/dL — ABNORMAL LOW (ref >=40)
LDL Cholesterol (Calc): 76 mg/dL
Non-HDL Cholesterol (Calc): 98 mg/dL (ref <130)
Total CHOL/HDL Ratio: 3.6 (calc) (ref <5.0)
Triglycerides: 134 mg/dL (ref <150)

## 2024-01-22 LAB — C. TRACHOMATIS/N. GONORRHOEAE RNA
C. trachomatis RNA, TMA: NOT DETECTED
N. gonorrhoeae RNA, TMA: NOT DETECTED

## 2024-01-22 LAB — T-HELPER CELLS (CD4) COUNT (NOT AT ARMC)
Absolute CD4: 476 {cells}/uL — ABNORMAL LOW (ref 490–1740)
CD4 T Helper %: 25 % — ABNORMAL LOW (ref 30–61)
Total lymphocyte count: 1873 {cells}/uL (ref 850–3900)

## 2024-01-22 LAB — RPR: RPR Ser Ql: NONREACTIVE

## 2024-02-02 ENCOUNTER — Ambulatory Visit
Admission: RE | Admit: 2024-02-02 | Discharge: 2024-02-02 | Disposition: A | Discharge status: Home / Self Care | Source: Ambulatory Visit | Attending: Family Medicine | Admitting: Family Medicine

## 2024-02-02 ENCOUNTER — Other Ambulatory Visit: Payer: Self-pay | Admitting: Family Medicine

## 2024-02-02 DIAGNOSIS — R059 Cough, unspecified: Secondary | ICD-10-CM

## 2024-02-02 NOTE — Progress Notes (Unsigned)
 Chief complaint: follow-up for HIV disease on medications  Subjective:    Patient ID: Jay Hoffman, male    DOB: Sep 18, 1946, 79 y.o.   MRN: [id number]  HPI  Discussed the use of AI scribe software for clinical note transcription with the patient, who gave verbal consent to proceed.  History of Present Illness   The patient, a 78 year old with a history of HIV, chronic kidney disease, and glaucoma, presents for a routine follow-up. He reports no new symptoms or concerns. He recently had a chest x-ray due to shortness of breath with exertion, which he attributes to a lack of exercise. The results of the chest x-ray, which were viewed on a different portal, reportedly came back normal. He also had a recent annual check-up with Dr. Cam Hai.  The patient's HIV is well-managed on [name], with a recent viral load of less than 20 but detected, which caused some concern for the patient. However, the doctor reassured him that this is still considered undetectable since it is <50 and <20 as well. His CD4 count is 476.  He also has a history of kidney stones, for which he underwent lithotripsy. His creatinine is stable at 1.66. He is also on pravastatin for cholesterol management, with a recent LDL in the desired range and an HDL in the 30s.  For his glaucoma, he is on four different eye drops. He also has a prescription for Viagra, but reports not having used it in a long time.  The patient received his last COVID-19 vaccine in October and is due for a booster in a couple of months. He also received his flu shot this season.       Past Medical History:  Diagnosis Date   Acute cholecystitis    BPH (benign prostatic hyperplasia)    Cataract    Cryptococcosis (HCC)    Diabetes mellitus without complication (HCC)    GERD (gastroesophageal reflux disease)    Glaucoma    Human immunodeficiency virus (HIV) disease (HCC)    Kidney stones 01/02/2022   Other and unspecified hyperlipidemia     Pneumocystosis (HCC)    Problems related to high-risk sexual behavior    Unspecified personal history presenting hazards to health     Past Surgical History:  Procedure Laterality Date   CHOLECYSTECTOMY     ERCP     EXTRACORPOREAL SHOCK WAVE LITHOTRIPSY Left 12/27/2021   Procedure: LEFT EXTRACORPOREAL SHOCK WAVE LITHOTRIPSY (ESWL);  Surgeon: Belva Agee, MD;  Location: [facility];  Service: Urology;  Laterality: Left;    Family History  Problem Relation Age of Onset   Esophageal cancer Father    Kidney disease Sister    Diabetes Paternal Aunt    Cancer Other    Colon cancer Neg Hx    Rectal cancer Neg Hx    Stomach cancer Neg Hx       Social History   Socioeconomic History   Marital status: Married    Spouse name: Not on file   Number of children: 2   Years of education: Not on file   Highest education level: Not on file  Occupational History   Occupation: retired IT sales professional  Tobacco Use   Smoking status: Never   Smokeless tobacco: Never  Substance and Sexual Activity   Alcohol use: Yes    Comment: occasional   Beer   Drug use: No   Sexual activity: Yes    Partners: Female    Comment: declined condoms  Other  Topics Concern   Not on file  Social History Narrative   Daily caffeine   Social Drivers of Health   Financial Resource Strain: Not on file  Food Insecurity: Not on file  Transportation Needs: Not on file  Physical Activity: Not on file  Stress: Not on file  Social Connections: Not on file    No Known Allergies   Current Outpatient Medications:    aspirin 81 MG chewable tablet, Chew by mouth daily., Disp: , Rfl:    dolutegravir-lamiVUDine ([name]) 50-300 MG tablet, Take 1 tablet by mouth daily., Disp: 30 tablet, Rfl: 1   dorzolamide-timolol (COSOPT) 22.3-6.8 MG/ML ophthalmic solution, , Disp: , Rfl:    finasteride (PROSCAR) 5 MG tablet, Take 5 mg by mouth daily., Disp: , Rfl:    pravastatin (PRAVACHOL) 80 MG tablet, , Disp:  , Rfl: 5   PRESCRIPTION MEDICATION, 3 prescription eye gtts for glaucoma, Disp: , Rfl:    pseudoephedrine-guaifenesin (MUCINEX D) 60-600 MG 12 hr tablet, Take 1 tablet by mouth every 12 (twelve) hours., Disp: , Rfl:    sildenafil (VIAGRA) 50 MG tablet, Take one half to one whole one half hour before sexual activity, Disp: 20 tablet, Rfl: 4  Current Facility-Administered Medications:    0.9 %  sodium chloride infusion, 500 mL, Intravenous, Once, Hilarie Fredrickson, MD   Review of Systems  Constitutional:  Negative for activity change, appetite change, chills, diaphoresis, fatigue, fever and unexpected weight change.  HENT:  Negative for congestion, rhinorrhea, sinus pressure, sneezing, sore throat and trouble swallowing.   Eyes:  Negative for photophobia and visual disturbance.  Respiratory:  Negative for cough, chest tightness, shortness of breath, wheezing and stridor.   Cardiovascular:  Negative for chest pain, palpitations and leg swelling.  Gastrointestinal:  Negative for abdominal distention, abdominal pain, anal bleeding, blood in stool, constipation, diarrhea, nausea and vomiting.  Genitourinary:  Negative for difficulty urinating, dysuria, flank pain and hematuria.  Musculoskeletal:  Negative for arthralgias, back pain, gait problem, joint swelling and myalgias.  Skin:  Negative for color change, pallor, rash and wound.  Neurological:  Negative for dizziness, tremors, weakness and light-headedness.  Hematological:  Negative for adenopathy. Does not bruise/bleed easily.  Psychiatric/Behavioral:  Negative for agitation, behavioral problems, confusion, decreased concentration, dysphoric mood and sleep disturbance.        Objective:   Physical Exam Constitutional:      Appearance: He is well-developed.  HENT:     Head: Normocephalic and atraumatic.  Eyes:     Conjunctiva/sclera: Conjunctivae normal.  Cardiovascular:     Rate and Rhythm: Normal rate and regular rhythm.  Pulmonary:      Effort: Pulmonary effort is normal. No respiratory distress.     Breath sounds: No wheezing.  Abdominal:     General: There is no distension.     Palpations: Abdomen is soft.  Musculoskeletal:        General: No tenderness. Normal range of motion.     Cervical back: Normal range of motion and neck supple.  Skin:    General: Skin is warm and dry.     Coloration: Skin is not pale.     Findings: No erythema or rash.  Neurological:     General: No focal deficit present.     Mental Status: He is alert and oriented to person, place, and time.  Psychiatric:        Mood and Affect: Mood normal.        Behavior: Behavior normal.  Thought Content: Thought content normal.        Judgment: Judgment normal.           Assessment & Plan:   Assessment and Plan    HIV Viral load less than 20 but detected, CD4 476. Stable on [name]. -Continue [name].  Chronic Kidney Disease Creatinine 1.66, stable. -Continue current management.  Hyperlipidemia LDL within target range on Pravastatin. -Continue Pravastatin.  Glaucoma On four different eye drops. -Continue current management.   Kidney stones: sp lithotripsy  General Health Maintenance -Received flu and COVID vaccines. -Eligible for COVID booster in a couple of months. -Next appointment in 10 months with labs prior to visit.

## 2024-02-03 ENCOUNTER — Other Ambulatory Visit: Payer: Self-pay

## 2024-02-03 ENCOUNTER — Encounter: Payer: Self-pay | Admitting: Infectious Disease

## 2024-02-03 ENCOUNTER — Ambulatory Visit: Payer: Medicare HMO | Admitting: Infectious Disease

## 2024-02-03 VITALS — BP 140/84 | HR 74 | Temp 97.6°F | Ht 68.0 in | Wt 210.0 lb

## 2024-02-03 DIAGNOSIS — E785 Hyperlipidemia, unspecified: Secondary | ICD-10-CM | POA: Diagnosis not present

## 2024-02-03 DIAGNOSIS — H409 Unspecified glaucoma: Secondary | ICD-10-CM | POA: Diagnosis not present

## 2024-02-03 DIAGNOSIS — N200 Calculus of kidney: Secondary | ICD-10-CM | POA: Diagnosis not present

## 2024-02-03 DIAGNOSIS — N183 Chronic kidney disease, stage 3 unspecified: Secondary | ICD-10-CM

## 2024-02-03 DIAGNOSIS — B20 Human immunodeficiency virus [HIV] disease: Secondary | ICD-10-CM | POA: Diagnosis not present

## 2024-02-03 DIAGNOSIS — N529 Male erectile dysfunction, unspecified: Secondary | ICD-10-CM

## 2024-02-03 MED ORDER — DOVATO 50-300 MG PO TABS
1.0000 | ORAL_TABLET | Freq: Every day | ORAL | 11 refills | Status: AC
  Filled 2024-02-03 – 2024-02-06 (×2): qty 30, 30d supply, fill #0
  Filled 2024-03-05: qty 30, 30d supply, fill #1
  Filled 2024-04-06: qty 30, 30d supply, fill #2
  Filled 2024-05-12: qty 30, 30d supply, fill #3
  Filled 2024-06-08: qty 30, 30d supply, fill #4
  Filled 2024-07-09: qty 30, 30d supply, fill #5
  Filled 2024-08-13: qty 30, 30d supply, fill #6
  Filled 2024-09-14: qty 30, 30d supply, fill #7
  Filled 2024-10-14: qty 30, 30d supply, fill #8
  Filled 2024-11-16 – 2024-11-17 (×2): qty 30, 30d supply, fill #9
  Filled 2024-12-15 – 2024-12-23 (×2): qty 30, 30d supply, fill #10

## 2024-02-06 ENCOUNTER — Other Ambulatory Visit: Payer: Self-pay

## 2024-02-06 ENCOUNTER — Other Ambulatory Visit: Payer: Self-pay | Admitting: Pharmacy Technician

## 2024-02-06 NOTE — Progress Notes (Signed)
 Specialty Pharmacy Refill Coordination Note  Jay Hoffman is a 78 y.o. male contacted today regarding refills of specialty medication(s) Dolutegravir-lamiVUDine ([name])   Patient requested Delivery   Delivery date: 02/13/24   Verified address: [address] [place]   Medication will be filled on 02/12/24.

## 2024-03-05 ENCOUNTER — Other Ambulatory Visit: Payer: Self-pay

## 2024-03-05 NOTE — Progress Notes (Signed)
 Specialty Pharmacy Refill Coordination Note  Jay Hoffman is a 78 y.o. male contacted today regarding refills of specialty medication(s) Dolutegravir-lamiVUDine ([name])   Patient requested Delivery   Delivery date: 03/15/24   Verified address: [address]   [place] [address]   Medication will be filled on 03/12/24.

## 2024-03-18 DIAGNOSIS — R0602 Shortness of breath: Secondary | ICD-10-CM | POA: Diagnosis not present

## 2024-04-06 ENCOUNTER — Other Ambulatory Visit (HOSPITAL_COMMUNITY): Payer: Self-pay

## 2024-04-06 NOTE — Progress Notes (Signed)
 Specialty Pharmacy Ongoing Clinical Assessment Note  Jay Hoffman is a 78 y.o. male who is being followed by the specialty pharmacy service for RxSp HIV   Patient's specialty medication(s) reviewed today: Dolutegravir -lamiVUDine  ([name] )   Missed doses in the last 4 weeks: 0   Patient/Caregiver did not have any additional questions or concerns.   Therapeutic benefit summary: Patient is achieving benefit   Adverse events/side effects summary: No adverse events/side effects   Patient's therapy is appropriate to: Continue    Goals Addressed             This Visit's Progress    Achieve Undetectable HIV Viral Load < 20   On track    Patient is on track. Patient will maintain adherence.  Patient's viral load remains undetectable as of 01/20/24.          Follow up:  6 months  Malachi Screws Specialty Pharmacist

## 2024-04-06 NOTE — Progress Notes (Signed)
 Specialty Pharmacy Refill Coordination Note  Jay Hoffman is a 78 y.o. male contacted today regarding refills of specialty medication(s) Dolutegravir -lamiVUDine  ([name] )   Patient requested Delivery   Delivery date: 04/15/24   Verified address: [address]   [place] [address]   Medication will be filled on 04/14/24.

## 2024-05-12 ENCOUNTER — Other Ambulatory Visit: Payer: Self-pay

## 2024-05-12 DIAGNOSIS — H401133 Primary open-angle glaucoma, bilateral, severe stage: Secondary | ICD-10-CM | POA: Diagnosis not present

## 2024-05-12 DIAGNOSIS — Z961 Presence of intraocular lens: Secondary | ICD-10-CM | POA: Diagnosis not present

## 2024-05-12 NOTE — Progress Notes (Signed)
 Specialty Pharmacy Refill Coordination Note  Jay Hoffman is a 78 y.o. male contacted today regarding refills of specialty medication(s) Dolutegravir -lamiVUDine  ([name] )   Patient requested Delivery   Delivery date: 05/14/24   Verified address: [address]   [place] [address]   Medication will be filled on 05/12/24 or 05/13/24.

## 2024-06-08 ENCOUNTER — Other Ambulatory Visit: Payer: Self-pay | Admitting: Pharmacy Technician

## 2024-06-08 ENCOUNTER — Other Ambulatory Visit: Payer: Self-pay

## 2024-06-08 ENCOUNTER — Other Ambulatory Visit (HOSPITAL_COMMUNITY): Payer: Self-pay

## 2024-06-08 NOTE — Progress Notes (Signed)
 Specialty Pharmacy Refill Coordination Note  Jay Hoffman is a 78 y.o. male contacted today regarding refills of specialty medication(s) Dolutegravir -lamiVUDine  ([name] )   Patient requested Delivery   Delivery date: 06/15/24   Verified address: [address]  [name]   Medication will be filled on 06/14/24.

## 2024-07-09 ENCOUNTER — Other Ambulatory Visit: Payer: Self-pay

## 2024-07-09 NOTE — Progress Notes (Signed)
 Specialty Pharmacy Refill Coordination Note  Jay Hoffman is a 78 y.o. male contacted today regarding refills of specialty medication(s) Dolutegravir -lamiVUDine  ([name] )   Patient requested Delivery   Delivery date: 07/13/24   Verified address: [address]  [place]   Medication will be filled on 07/12/24.

## 2024-07-12 ENCOUNTER — Other Ambulatory Visit: Payer: Self-pay

## 2024-08-04 ENCOUNTER — Other Ambulatory Visit (HOSPITAL_COMMUNITY): Payer: Self-pay

## 2024-08-13 ENCOUNTER — Other Ambulatory Visit: Payer: Self-pay

## 2024-08-13 NOTE — Progress Notes (Signed)
 Specialty Pharmacy Refill Coordination Note  Jay Hoffman is a 78 y.o. male contacted today regarding refills of specialty medication(s) Dolutegravir -lamiVUDine  ([name] )   Patient requested Delivery   Delivery date: 08/20/24   Verified address: [address]  [place]   Medication will be filled on 09.18.25.

## 2024-08-19 ENCOUNTER — Other Ambulatory Visit: Payer: Self-pay

## 2024-09-14 ENCOUNTER — Other Ambulatory Visit: Payer: Self-pay | Admitting: Pharmacy Technician

## 2024-09-14 ENCOUNTER — Other Ambulatory Visit: Payer: Self-pay

## 2024-09-14 NOTE — Progress Notes (Signed)
 Specialty Pharmacy Refill Coordination Note  Jay Hoffman is a 78 y.o. male contacted today regarding refills of specialty medication(s) Dolutegravir -lamiVUDine  ([name] )   Patient requested Delivery   Delivery date: 09/22/24   Verified address: [address]  [place]   Medication will be filled on 09/21/24.

## 2024-09-21 ENCOUNTER — Other Ambulatory Visit: Payer: Self-pay

## 2024-09-29 ENCOUNTER — Other Ambulatory Visit: Payer: Self-pay

## 2024-09-29 DIAGNOSIS — Z961 Presence of intraocular lens: Secondary | ICD-10-CM | POA: Diagnosis not present

## 2024-09-29 DIAGNOSIS — H401133 Primary open-angle glaucoma, bilateral, severe stage: Secondary | ICD-10-CM | POA: Diagnosis not present

## 2024-10-14 ENCOUNTER — Other Ambulatory Visit: Payer: Self-pay

## 2024-10-18 ENCOUNTER — Other Ambulatory Visit (HOSPITAL_COMMUNITY): Payer: Self-pay

## 2024-10-18 ENCOUNTER — Other Ambulatory Visit: Payer: Self-pay

## 2024-10-18 NOTE — Progress Notes (Signed)
 Specialty Pharmacy Refill Coordination Note  Jay Hoffman is a 78 y.o. male contacted today regarding refills of specialty medication(s) Dolutegravir -lamiVUDine  ([name] )   Patient requested Delivery   Delivery date: 10/26/24   Verified address: [address]  [place]   Medication will be filled on: 10/25/24

## 2024-11-16 ENCOUNTER — Other Ambulatory Visit (HOSPITAL_COMMUNITY): Payer: Self-pay

## 2024-11-17 ENCOUNTER — Other Ambulatory Visit: Payer: Self-pay

## 2024-11-19 ENCOUNTER — Other Ambulatory Visit: Payer: Self-pay

## 2024-11-19 NOTE — Progress Notes (Signed)
 Specialty Pharmacy Refill Coordination Note  Jay Hoffman is a 78 y.o. male contacted today regarding refills of specialty medication(s) Dolutegravir -lamiVUDine  ([name] )   Patient requested Delivery   Delivery date: 11/23/24   Verified address: [address]  [place]   Medication will be filled on: 11/22/24

## 2024-12-07 ENCOUNTER — Other Ambulatory Visit

## 2024-12-07 ENCOUNTER — Other Ambulatory Visit (HOSPITAL_COMMUNITY)
Admission: RE | Admit: 2024-12-07 | Discharge: 2024-12-07 | Disposition: A | Discharge status: Home / Self Care | Source: Ambulatory Visit | Attending: Infectious Disease | Admitting: Infectious Disease

## 2024-12-07 ENCOUNTER — Other Ambulatory Visit: Payer: Self-pay

## 2024-12-07 DIAGNOSIS — N183 Chronic kidney disease, stage 3 unspecified: Secondary | ICD-10-CM

## 2024-12-07 DIAGNOSIS — B20 Human immunodeficiency virus [HIV] disease: Secondary | ICD-10-CM | POA: Diagnosis present

## 2024-12-07 DIAGNOSIS — E785 Hyperlipidemia, unspecified: Secondary | ICD-10-CM

## 2024-12-08 LAB — URINE CYTOLOGY ANCILLARY ONLY
Chlamydia: NEGATIVE
Comment: NEGATIVE
Comment: NORMAL
Neisseria Gonorrhea: NEGATIVE

## 2024-12-08 LAB — T-HELPER CELL (CD4) - (RCID CLINIC ONLY)
CD4 % Helper T Cell: 27 % — ABNORMAL LOW (ref 33–65)
CD4 T Cell Abs: 499 /uL (ref 400–1790)

## 2024-12-09 LAB — COMPLETE METABOLIC PANEL WITHOUT GFR
AG Ratio: 1.2 (calc) (ref 1.0–2.5)
ALT: 12 U/L (ref 9–46)
AST: 17 U/L (ref 10–35)
Albumin: 4.3 g/dL (ref 3.6–5.1)
Alkaline phosphatase (APISO): 86 U/L (ref 35–144)
BUN/Creatinine Ratio: 10 (calc) (ref 6–22)
BUN: 16 mg/dL (ref 7–25)
CO2: 26 mmol/L (ref 20–32)
Calcium: 9.2 mg/dL (ref 8.6–10.3)
Chloride: 107 mmol/L (ref 98–110)
Creat: 1.57 mg/dL — ABNORMAL HIGH (ref 0.70–1.28)
Globulin: 3.7 g/dL (ref 1.9–3.7)
Glucose, Bld: 112 mg/dL — ABNORMAL HIGH (ref 65–99)
Potassium: 3.9 mmol/L (ref 3.5–5.3)
Sodium: 139 mmol/L (ref 135–146)
Total Bilirubin: 0.4 mg/dL (ref 0.2–1.2)
Total Protein: 8 g/dL (ref 6.1–8.1)

## 2024-12-09 LAB — CBC WITH DIFFERENTIAL/PLATELET
Absolute Lymphocytes: 2104 {cells}/uL (ref 850–3900)
Absolute Monocytes: 392 {cells}/uL (ref 200–950)
Basophils Absolute: 32 {cells}/uL (ref 0–200)
Basophils Relative: 0.6 %
Eosinophils Absolute: 133 {cells}/uL (ref 15–500)
Eosinophils Relative: 2.5 %
HCT: 45.7 % (ref 39.4–51.1)
Hemoglobin: 15.1 g/dL (ref 13.2–17.1)
MCH: 30.1 pg (ref 27.0–33.0)
MCHC: 33 g/dL (ref 31.6–35.4)
MCV: 91 fL (ref 81.4–101.7)
MPV: 9.5 fL (ref 7.5–12.5)
Monocytes Relative: 7.4 %
Neutro Abs: 2639 {cells}/uL (ref 1500–7800)
Neutrophils Relative %: 49.8 %
Platelets: 232 Thousand/uL (ref 140–400)
RBC: 5.02 Million/uL (ref 4.20–5.80)
RDW: 13.5 % (ref 11.0–15.0)
Total Lymphocyte: 39.7 %
WBC: 5.3 Thousand/uL (ref 3.8–10.8)

## 2024-12-09 LAB — LIPID PANEL
Cholesterol: 130 mg/dL
HDL: 32 mg/dL — ABNORMAL LOW
LDL Cholesterol (Calc): 77 mg/dL
Non-HDL Cholesterol (Calc): 98 mg/dL
Total CHOL/HDL Ratio: 4.1 (calc)
Triglycerides: 133 mg/dL

## 2024-12-09 LAB — SYPHILIS: RPR W/REFLEX TO RPR TITER AND TREPONEMAL ANTIBODIES, TRADITIONAL SCREENING AND DIAGNOSIS ALGORITHM: RPR Ser Ql: NONREACTIVE

## 2024-12-09 LAB — HIV-1 RNA QUANT-NO REFLEX-BLD
HIV 1 RNA Quant: <20 {copies}/mL — AB
HIV-1 RNA Quant, Log: <1.3 {Log_copies}/mL — AB

## 2024-12-15 ENCOUNTER — Other Ambulatory Visit: Payer: Self-pay

## 2024-12-16 ENCOUNTER — Other Ambulatory Visit: Payer: Self-pay

## 2024-12-20 ENCOUNTER — Other Ambulatory Visit: Payer: Self-pay

## 2024-12-20 ENCOUNTER — Other Ambulatory Visit (HOSPITAL_COMMUNITY): Payer: Self-pay

## 2024-12-21 ENCOUNTER — Other Ambulatory Visit (HOSPITAL_COMMUNITY): Payer: Self-pay

## 2024-12-21 ENCOUNTER — Telehealth: Payer: Self-pay

## 2024-12-21 ENCOUNTER — Ambulatory Visit: Payer: Self-pay | Admitting: Infectious Disease

## 2024-12-21 NOTE — Telephone Encounter (Signed)
 [facility] Patient Advocate Encounter   I was successful in securing patient a $ 2100.00 grant from [employer] to provide copayment coverage for [name] .  The patient's out of pocket cost will be $15.00 monthly.     I have spoken with the patient.    The billing information is as follows and has been shared with [facility].            Dates of Eligibility: 12/21/24 through 12/01/25  Patient knows to call the office with questions or concerns.  Arland Hutchinson, CPhT Specialty Pharmacy Patient [facility] Phone: [phone] Fax:  [phone]

## 2024-12-22 ENCOUNTER — Other Ambulatory Visit (HOSPITAL_COMMUNITY): Payer: Self-pay

## 2024-12-23 ENCOUNTER — Other Ambulatory Visit (HOSPITAL_COMMUNITY): Payer: Self-pay

## 2024-12-24 ENCOUNTER — Other Ambulatory Visit: Payer: Self-pay

## 2024-12-24 NOTE — Progress Notes (Signed)
 Specialty Pharmacy Refill Coordination Note  Jay Hoffman is a 79 y.o. male contacted today regarding refills of specialty medication(s) Dolutegravir -lamiVUDine  ([name] )   Patient requested (Patient-Rptd) Delivery   Delivery date: 12/30/24   Verified address: (Patient-Rptd) [address] [place], [place] [address]   Medication will be filled on: 12/29/24

## 2024-12-29 ENCOUNTER — Other Ambulatory Visit: Payer: Self-pay

## 2025-01-04 ENCOUNTER — Other Ambulatory Visit: Payer: Self-pay

## 2025-01-06 ENCOUNTER — Other Ambulatory Visit: Payer: Self-pay

## 2025-01-07 ENCOUNTER — Other Ambulatory Visit: Payer: Self-pay

## 2025-01-07 NOTE — Progress Notes (Signed)
 Specialty Pharmacy Ongoing Clinical Assessment Note  Jay Hoffman is a 79 y.o. male who is being followed by the specialty pharmacy service for RxSp HIV   Patient's specialty medication(s) reviewed today: Dolutegravir -lamiVUDine  ([name] )   Missed doses in the last 4 weeks: 0   Patient/Caregiver did not have any additional questions or concerns.   Therapeutic benefit summary: Patient is achieving benefit   Adverse events/side effects summary: No adverse events/side effects   Patient's therapy is appropriate to: Continue    Goals Addressed             This Visit's Progress    Achieve Undetectable HIV Viral Load < 20   On track    Patient is on track. Patient will maintain adherence.  Patient's viral load remains undetectable as of 12/07/24          Follow up: 12 months  [facility] Specialty Pharmacist

## 2025-01-14 ENCOUNTER — Ambulatory Visit: Admitting: Infectious Disease
